# Patient Record
Sex: Female | Born: 1945 | Race: Black or African American | Hispanic: No | Smoking: Never smoker
Health system: Southern US, Community
[De-identification: ages and names within clinical notes are randomized; demographics above are authoritative.]

## PROBLEM LIST (undated history)

## (undated) DIAGNOSIS — F32A Depression, unspecified: Secondary | ICD-10-CM

## (undated) DIAGNOSIS — E119 Type 2 diabetes mellitus without complications: Secondary | ICD-10-CM

## (undated) DIAGNOSIS — F329 Major depressive disorder, single episode, unspecified: Secondary | ICD-10-CM

## (undated) DIAGNOSIS — Z973 Presence of spectacles and contact lenses: Secondary | ICD-10-CM

## (undated) DIAGNOSIS — R42 Dizziness and giddiness: Secondary | ICD-10-CM

## (undated) DIAGNOSIS — G4730 Sleep apnea, unspecified: Secondary | ICD-10-CM

## (undated) DIAGNOSIS — E785 Hyperlipidemia, unspecified: Secondary | ICD-10-CM

## (undated) DIAGNOSIS — K219 Gastro-esophageal reflux disease without esophagitis: Secondary | ICD-10-CM

## (undated) DIAGNOSIS — G629 Polyneuropathy, unspecified: Secondary | ICD-10-CM

## (undated) HISTORY — PX: APPENDECTOMY: SHX54

## (undated) HISTORY — PX: BREAST SURGERY: SHX581

## (undated) HISTORY — PX: ABDOMINAL HYSTERECTOMY: SHX81

## (undated) HISTORY — PX: DILATION AND CURETTAGE OF UTERUS: SHX78

---

## 1990-03-21 HISTORY — PX: COLECTOMY: SHX59

## 1993-03-21 HISTORY — PX: KNEE ARTHROSCOPY: SUR90

## 2007-03-14 ENCOUNTER — Emergency Department (HOSPITAL_COMMUNITY): Admission: EM | Admit: 2007-03-14 | Discharge: 2007-03-14 | Payer: Self-pay | Admitting: Emergency Medicine

## 2009-11-21 ENCOUNTER — Observation Stay (HOSPITAL_COMMUNITY): Admission: EM | Admit: 2009-11-21 | Discharge: 2009-11-22 | Payer: Self-pay | Admitting: Emergency Medicine

## 2010-06-03 LAB — BASIC METABOLIC PANEL
BUN: 12 mg/dL (ref 6–23)
CO2: 29 mEq/L (ref 19–32)
Calcium: 9 mg/dL (ref 8.4–10.5)
GFR calc Af Amer: >60 mL/min (ref >60)
Glucose, Bld: 120 mg/dL — ABNORMAL HIGH (ref 70–99)
Potassium: 3.6 mEq/L (ref 3.5–5.1)

## 2010-06-03 LAB — CBC
Hemoglobin: 11.3 g/dL — ABNORMAL LOW (ref 12.0–15.0)
Hemoglobin: 12.1 g/dL (ref 12.0–15.0)
MCV: 87.2 fL (ref 78.0–100.0)
Platelets: 268 10*3/uL (ref 150–400)

## 2010-06-03 LAB — DIFFERENTIAL
Basophils Absolute: 0 10*3/uL (ref 0.0–0.1)
Basophils Absolute: 0 10*3/uL (ref 0.0–0.1)
Basophils Relative: 1 % (ref 0–1)
Basophils Relative: 1 % (ref 0–1)
Eosinophils Absolute: 0.4 10*3/uL (ref 0.0–0.7)
Eosinophils Absolute: 0.5 10*3/uL (ref 0.0–0.7)
Eosinophils Relative: 6 % — ABNORMAL HIGH (ref 0–5)
Eosinophils Relative: 7 % — ABNORMAL HIGH (ref 0–5)
Lymphs Abs: 3.6 10*3/uL (ref 0.7–4.0)
Monocytes Absolute: 0.4 10*3/uL (ref 0.1–1.0)
Monocytes Relative: 6 % (ref 3–12)
Neutro Abs: 2.3 10*3/uL (ref 1.7–7.7)

## 2010-06-03 LAB — CARDIAC PANEL(CRET KIN+CKTOT+MB+TROPI)
CK, MB: 0.9 ng/mL (ref 0.3–4.0)
CK, MB: 1.4 ng/mL (ref 0.3–4.0)
Relative Index: 0.4 (ref 0.0–2.5)
Total CK: 209 U/L — ABNORMAL HIGH (ref 7–177)
Total CK: 215 U/L — ABNORMAL HIGH (ref 7–177)
Total CK: 231 U/L — ABNORMAL HIGH (ref 7–177)
Troponin I: 0.02 ng/mL (ref 0.00–0.06)
Troponin I: 0.02 ng/mL (ref 0.00–0.06)

## 2010-06-03 LAB — POCT CARDIAC MARKERS
CKMB, poc: <1 ng/mL — ABNORMAL LOW (ref 1.0–8.0)
Myoglobin, poc: 75.8 ng/mL (ref 12–200)
Troponin i, poc: <0.05 ng/mL (ref 0.00–0.09)

## 2010-06-03 LAB — CK TOTAL AND CKMB (NOT AT ARMC)
CK, MB: 1 ng/mL (ref 0.3–4.0)
Relative Index: 0.7 (ref 0.0–2.5)
Total CK: 152 U/L (ref 7–177)

## 2010-06-03 LAB — LIPASE, BLOOD: Lipase: 26 U/L (ref 11–59)

## 2010-06-03 LAB — TSH: TSH: 0.942 u[IU]/mL (ref 0.350–4.500)

## 2010-06-03 LAB — LIPID PANEL
Cholesterol: 183 mg/dL (ref 0–200)
Total CHOL/HDL Ratio: 4.9 RATIO
Triglycerides: 122 mg/dL (ref <150)
VLDL: 24 mg/dL (ref 0–40)

## 2010-06-03 LAB — COMPREHENSIVE METABOLIC PANEL
AST: 22 U/L (ref 0–37)
Albumin: 3.2 g/dL — ABNORMAL LOW (ref 3.5–5.2)
Chloride: 108 mEq/L (ref 96–112)

## 2010-06-03 LAB — MAGNESIUM: Magnesium: 1.9 mg/dL (ref 1.5–2.5)

## 2010-06-03 LAB — GLUCOSE, CAPILLARY
Glucose-Capillary: 117 mg/dL — ABNORMAL HIGH (ref 70–99)
Glucose-Capillary: 197 mg/dL — ABNORMAL HIGH (ref 70–99)

## 2010-06-03 LAB — HEMOGLOBIN A1C
Hgb A1c MFr Bld: 6.2 % — ABNORMAL HIGH (ref <5.7)
Mean Plasma Glucose: 131 mg/dL — ABNORMAL HIGH (ref <117)

## 2012-06-20 ENCOUNTER — Other Ambulatory Visit: Payer: Self-pay | Admitting: Orthopedic Surgery

## 2012-06-21 ENCOUNTER — Encounter (HOSPITAL_BASED_OUTPATIENT_CLINIC_OR_DEPARTMENT_OTHER): Payer: Self-pay | Admitting: *Deleted

## 2012-06-21 NOTE — Progress Notes (Signed)
To come in for [id number]  Bring all meds and overnight bag and cpap

## 2012-06-22 ENCOUNTER — Other Ambulatory Visit: Payer: Self-pay

## 2012-06-22 ENCOUNTER — Encounter (HOSPITAL_BASED_OUTPATIENT_CLINIC_OR_DEPARTMENT_OTHER)
Admission: RE | Admit: 2012-06-22 | Discharge: 2012-06-22 | Disposition: A | Discharge status: Home / Self Care | Payer: Medicare Other | Source: Ambulatory Visit | Attending: Orthopedic Surgery | Admitting: Orthopedic Surgery

## 2012-06-22 LAB — BASIC METABOLIC PANEL
BUN: 13 mg/dL (ref 6–23)
Calcium: 9.2 mg/dL (ref 8.4–10.5)
Creatinine, Ser: 0.59 mg/dL (ref 0.50–1.10)
GFR calc non Af Amer: >90 mL/min (ref >90)
Glucose, Bld: 96 mg/dL (ref 70–99)

## 2012-06-26 ENCOUNTER — Encounter (HOSPITAL_BASED_OUTPATIENT_CLINIC_OR_DEPARTMENT_OTHER): Payer: Self-pay | Admitting: *Deleted

## 2012-06-26 ENCOUNTER — Ambulatory Visit (HOSPITAL_BASED_OUTPATIENT_CLINIC_OR_DEPARTMENT_OTHER)
Admission: RE | Admit: 2012-06-26 | Discharge: 2012-06-26 | Disposition: A | Discharge status: Home / Self Care | Payer: Medicare Other | Source: Ambulatory Visit | Attending: Orthopedic Surgery | Admitting: Orthopedic Surgery

## 2012-06-26 ENCOUNTER — Encounter (HOSPITAL_BASED_OUTPATIENT_CLINIC_OR_DEPARTMENT_OTHER)
Admission: RE | Disposition: A | Discharge status: Home / Self Care | Payer: Self-pay | Source: Ambulatory Visit | Attending: Orthopedic Surgery

## 2012-06-26 ENCOUNTER — Encounter (HOSPITAL_BASED_OUTPATIENT_CLINIC_OR_DEPARTMENT_OTHER): Payer: Self-pay | Admitting: Anesthesiology

## 2012-06-26 ENCOUNTER — Ambulatory Visit (HOSPITAL_BASED_OUTPATIENT_CLINIC_OR_DEPARTMENT_OTHER): Payer: Medicare Other | Admitting: Anesthesiology

## 2012-06-26 DIAGNOSIS — Z7982 Long term (current) use of aspirin: Secondary | ICD-10-CM | POA: Insufficient documentation

## 2012-06-26 DIAGNOSIS — E785 Hyperlipidemia, unspecified: Secondary | ICD-10-CM | POA: Insufficient documentation

## 2012-06-26 DIAGNOSIS — Z91041 Radiographic dye allergy status: Secondary | ICD-10-CM | POA: Insufficient documentation

## 2012-06-26 DIAGNOSIS — W19XXXA Unspecified fall, initial encounter: Secondary | ICD-10-CM | POA: Insufficient documentation

## 2012-06-26 DIAGNOSIS — Z794 Long term (current) use of insulin: Secondary | ICD-10-CM | POA: Insufficient documentation

## 2012-06-26 DIAGNOSIS — E119 Type 2 diabetes mellitus without complications: Secondary | ICD-10-CM | POA: Insufficient documentation

## 2012-06-26 DIAGNOSIS — S52599A Other fractures of lower end of unspecified radius, initial encounter for closed fracture: Secondary | ICD-10-CM | POA: Insufficient documentation

## 2012-06-26 DIAGNOSIS — Z79899 Other long term (current) drug therapy: Secondary | ICD-10-CM | POA: Insufficient documentation

## 2012-06-26 DIAGNOSIS — Y92410 Unspecified street and highway as the place of occurrence of the external cause: Secondary | ICD-10-CM | POA: Insufficient documentation

## 2012-06-26 DIAGNOSIS — F3289 Other specified depressive episodes: Secondary | ICD-10-CM | POA: Insufficient documentation

## 2012-06-26 DIAGNOSIS — F329 Major depressive disorder, single episode, unspecified: Secondary | ICD-10-CM | POA: Insufficient documentation

## 2012-06-26 DIAGNOSIS — G4730 Sleep apnea, unspecified: Secondary | ICD-10-CM | POA: Insufficient documentation

## 2012-06-26 DIAGNOSIS — K219 Gastro-esophageal reflux disease without esophagitis: Secondary | ICD-10-CM | POA: Insufficient documentation

## 2012-06-26 DIAGNOSIS — G609 Hereditary and idiopathic neuropathy, unspecified: Secondary | ICD-10-CM | POA: Insufficient documentation

## 2012-06-26 HISTORY — DX: Dizziness and giddiness: R42

## 2012-06-26 HISTORY — DX: Presence of spectacles and contact lenses: Z97.3

## 2012-06-26 HISTORY — DX: Polyneuropathy, unspecified: G62.9

## 2012-06-26 HISTORY — DX: Gastro-esophageal reflux disease without esophagitis: K21.9

## 2012-06-26 HISTORY — PX: OPEN REDUCTION INTERNAL FIXATION (ORIF) DISTAL RADIAL FRACTURE: SHX5989

## 2012-06-26 HISTORY — DX: Hyperlipidemia, unspecified: E78.5

## 2012-06-26 HISTORY — DX: Depression, unspecified: F32.A

## 2012-06-26 HISTORY — DX: Sleep apnea, unspecified: G47.30

## 2012-06-26 HISTORY — DX: Type 2 diabetes mellitus without complications: E11.9

## 2012-06-26 HISTORY — DX: Major depressive disorder, single episode, unspecified: F32.9

## 2012-06-26 LAB — GLUCOSE, CAPILLARY
Glucose-Capillary: 112 mg/dL — ABNORMAL HIGH (ref 70–99)
Glucose-Capillary: 92 mg/dL (ref 70–99)

## 2012-06-26 SURGERY — OPEN REDUCTION INTERNAL FIXATION (ORIF) DISTAL RADIUS FRACTURE
Anesthesia: General | Laterality: Left | Wound class: Clean

## 2012-06-26 MED ORDER — CEFAZOLIN SODIUM-DEXTROSE 2-3 GM-% IV SOLR
2.0000 g | INTRAVENOUS | Status: AC
  Administered 2012-06-26: 2 g via INTRAVENOUS

## 2012-06-26 MED ORDER — LIDOCAINE HCL 1 % IJ SOLN
INTRAMUSCULAR | Status: DC | PRN
  Administered 2012-06-26: 2 mL via INTRADERMAL

## 2012-06-26 MED ORDER — CHLORHEXIDINE GLUCONATE 4 % EX LIQD: 60.0000 mL | Freq: Once | CUTANEOUS | Status: DC

## 2012-06-26 MED ORDER — OXYCODONE-ACETAMINOPHEN 5-325 MG PO TABS: ORAL_TABLET | ORAL | Status: AC

## 2012-06-26 MED ORDER — METOCLOPRAMIDE HCL 5 MG/ML IJ SOLN
INTRAMUSCULAR | Status: DC | PRN
  Administered 2012-06-26: 10 mg via INTRAVENOUS

## 2012-06-26 MED ORDER — METOCLOPRAMIDE HCL 5 MG/ML IJ SOLN: 10.0000 mg | Freq: Once | INTRAMUSCULAR | Status: DC | PRN

## 2012-06-26 MED ORDER — OXYCODONE HCL 5 MG/5ML PO SOLN
5.0000 mg | Freq: Once | ORAL | Status: DC | PRN
Start: 2012-06-26 — End: 2012-06-26

## 2012-06-26 MED ORDER — FENTANYL CITRATE 0.05 MG/ML IJ SOLN: 25.0000 ug | INTRAMUSCULAR | Status: DC | PRN

## 2012-06-26 MED ORDER — MIDAZOLAM HCL 2 MG/2ML IJ SOLN
1.0000 mg | INTRAMUSCULAR | Status: DC | PRN
  Administered 2012-06-26: 2 mg via INTRAVENOUS

## 2012-06-26 MED ORDER — FENTANYL CITRATE 0.05 MG/ML IJ SOLN
50.0000 ug | INTRAMUSCULAR | Status: DC | PRN
  Administered 2012-06-26: 100 ug via INTRAVENOUS

## 2012-06-26 MED ORDER — ROPIVACAINE HCL 5 MG/ML IJ SOLN
INTRAMUSCULAR | Status: DC | PRN
  Administered 2012-06-26: 30 mL

## 2012-06-26 MED ORDER — EPHEDRINE SULFATE 50 MG/ML IJ SOLN
INTRAMUSCULAR | Status: DC | PRN
  Administered 2012-06-26: 10 mg via INTRAVENOUS

## 2012-06-26 MED ORDER — ONDANSETRON HCL 4 MG/2ML IJ SOLN
INTRAMUSCULAR | Status: DC | PRN
  Administered 2012-06-26: 4 mg via INTRAVENOUS

## 2012-06-26 MED ORDER — OXYCODONE HCL 5 MG PO TABS: 5.0000 mg | ORAL_TABLET | Freq: Once | ORAL | Status: DC | PRN

## 2012-06-26 MED ORDER — LIDOCAINE HCL (CARDIAC) 20 MG/ML IV SOLN
INTRAVENOUS | Status: DC | PRN
  Administered 2012-06-26: 60 mg via INTRAVENOUS

## 2012-06-26 MED ORDER — PROPOFOL 10 MG/ML IV BOLUS
INTRAVENOUS | Status: DC | PRN
  Administered 2012-06-26: 200 mg via INTRAVENOUS
  Administered 2012-06-26: 30 mg via INTRAVENOUS

## 2012-06-26 MED ORDER — LACTATED RINGERS IV SOLN
INTRAVENOUS | Status: DC
  Administered 2012-06-26: 11:00:00 via INTRAVENOUS

## 2012-06-26 SURGICAL SUPPLY — 72 items
BANDAGE ELASTIC 3 VELCRO ST LF (GAUZE/BANDAGES/DRESSINGS) ×2 IMPLANT
BANDAGE GAUZE ELAST BULKY 4 IN (GAUZE/BANDAGES/DRESSINGS) ×2 IMPLANT
BIT DRILL 2.0 LNG QUCK RELEASE (BIT) ×1 IMPLANT
BIT DRILL 2.8X5 QR DISP (BIT) ×2 IMPLANT
BLADE MINI RND TIP GREEN BEAV (BLADE) IMPLANT
BLADE SURG 15 STRL LF DISP TIS (BLADE) ×2 IMPLANT
BLADE SURG 15 STRL SS (BLADE) ×2
BNDG ESMARK 4X9 LF (GAUZE/BANDAGES/DRESSINGS) ×2 IMPLANT
BONE CHIP PRESERV 5CC PCAN5 (Bone Implant) ×2 IMPLANT
CHLORAPREP W/TINT 26ML (MISCELLANEOUS) ×2 IMPLANT
CLOTH BEACON ORANGE TIMEOUT ST (SAFETY) ×2 IMPLANT
CORDS BIPOLAR (ELECTRODE) ×2 IMPLANT
COVER MAYO STAND STRL (DRAPES) ×2 IMPLANT
COVER TABLE BACK 60X90 (DRAPES) ×2 IMPLANT
DRAPE EXTREMITY T 121X128X90 (DRAPE) ×2 IMPLANT
DRAPE OEC MINIVIEW 54X84 (DRAPES) ×2 IMPLANT
DRAPE SURG 17X23 STRL (DRAPES) ×2 IMPLANT
DRILL 2.0 LNG QUICK RELEASE (BIT) ×2
GAUZE XEROFORM 1X8 LF (GAUZE/BANDAGES/DRESSINGS) ×2 IMPLANT
GLOVE BIO SURGEON STRL SZ 6.5 (GLOVE) ×2 IMPLANT
GLOVE BIO SURGEON STRL SZ7.5 (GLOVE) ×2 IMPLANT
GLOVE BIOGEL PI IND STRL 8 (GLOVE) ×1 IMPLANT
GLOVE BIOGEL PI IND STRL 8.5 (GLOVE) ×1 IMPLANT
GLOVE BIOGEL PI INDICATOR 8 (GLOVE) ×1
GLOVE BIOGEL PI INDICATOR 8.5 (GLOVE) ×1
GLOVE ECLIPSE 6.5 STRL STRAW (GLOVE) ×2 IMPLANT
GLOVE INDICATOR 6.5 STRL GRN (GLOVE) ×2 IMPLANT
GLOVE INDICATOR 7.0 STRL GRN (GLOVE) ×2 IMPLANT
GLOVE SKINSENSE NS SZ6.5 (GLOVE) ×1
GLOVE SKINSENSE STRL SZ6.5 (GLOVE) ×1 IMPLANT
GLOVE SURG ORTHO 8.0 STRL STRW (GLOVE) ×2 IMPLANT
GOWN BRE IMP PREV XXLGXLNG (GOWN DISPOSABLE) ×4 IMPLANT
GOWN PREVENTION PLUS XLARGE (GOWN DISPOSABLE) ×4 IMPLANT
GUIDEWIRE ORTHO 0.054X6 (WIRE) ×6 IMPLANT
NEEDLE HYPO 25X1 1.5 SAFETY (NEEDLE) ×2 IMPLANT
NS IRRIG 1000ML POUR BTL (IV SOLUTION) ×2 IMPLANT
PACK BASIN DAY SURGERY FS (CUSTOM PROCEDURE TRAY) ×2 IMPLANT
PAD CAST 3X4 CTTN HI CHSV (CAST SUPPLIES) ×1 IMPLANT
PADDING CAST ABS 4INX4YD NS (CAST SUPPLIES) ×1
PADDING CAST ABS COTTON 4X4 ST (CAST SUPPLIES) ×1 IMPLANT
PADDING CAST COTTON 3X4 STRL (CAST SUPPLIES) ×1
PLATE LEFT DIST RADIUS NARROW (Plate) ×2 IMPLANT
SCREW CORT FT 20X2.3XLCK HEX (Screw) ×1 IMPLANT
SCREW CORT FT 22X2.3XLCK HEX (Screw) ×1 IMPLANT
SCREW CORTICAL LOCKING 2.3X18M (Screw) ×2 IMPLANT
SCREW CORTICAL LOCKING 2.3X20M (Screw) ×2 IMPLANT
SCREW CORTICAL LOCKING 2.3X22M (Screw) ×2 IMPLANT
SCREW FX18X2.3XSMTH LCK NS CRT (Screw) ×2 IMPLANT
SCREW FX20X2.3XSMTH LCK NS CRT (Screw) ×1 IMPLANT
SCREW FX22X2.3XLCK SMTH NS CRT (Screw) ×1 IMPLANT
SCREW HEXALOBE NON-LOCK 3.5X14 (Screw) ×2 IMPLANT
SCREW NLCKG 13 3.5X13 HEXA (Screw) ×1 IMPLANT
SCREW NON TOGG 2.3X20MM (Screw) ×2 IMPLANT
SCREW NON TOGG 2.3X22MM (Screw) ×2 IMPLANT
SCREW NON-LOCK 3.5X13 (Screw) ×2 IMPLANT
SCREW NONLOCK HEX 3.5X12 (Screw) ×4 IMPLANT
SLEEVE SCD COMPRESS KNEE MED (MISCELLANEOUS) ×2 IMPLANT
SPLINT PLASTER CAST XFAST 4X15 (CAST SUPPLIES) IMPLANT
SPLINT PLASTER XTRA FAST SET 4 (CAST SUPPLIES)
SPONGE GAUZE 4X4 12PLY (GAUZE/BANDAGES/DRESSINGS) ×2 IMPLANT
STOCKINETTE 4X48 STRL (DRAPES) ×2 IMPLANT
SUCTION FRAZIER TIP 10 FR DISP (SUCTIONS) IMPLANT
SUT ETHILON 3 0 PS 1 (SUTURE) IMPLANT
SUT ETHILON 4 0 PS 2 18 (SUTURE) ×4 IMPLANT
SUT VIC AB 3-0 PS1 18 (SUTURE)
SUT VIC AB 3-0 PS1 18XBRD (SUTURE) IMPLANT
SUT VICRYL 4-0 PS2 18IN ABS (SUTURE) ×2 IMPLANT
SYR BULB 3OZ (MISCELLANEOUS) ×2 IMPLANT
SYR CONTROL 10ML LL (SYRINGE) ×2 IMPLANT
TOWEL OR 17X24 6PK STRL BLUE (TOWEL DISPOSABLE) ×2 IMPLANT
TUBE CONNECTING 20X1/4 (TUBING) IMPLANT
UNDERPAD 30X30 INCONTINENT (UNDERPADS AND DIAPERS) ×2 IMPLANT

## 2012-06-26 NOTE — H&P (Signed)
Adriana Wiley is an 67 y.o. female.   Chief Complaint: left distal radius fracture HPI: 67 yo rhd female states Adriana Wiley fell on sidewalk 06/11/12 injuring left wrist.  Seen at [facility] where XR and CT revealed comminuted intraarticular distal radius fracture.  Adriana Wiley was referred for further care.  Reports no previous injury to wrist.  Past Medical History  Diagnosis Date  . Hyperlipemia   . Sleep apnea     uses a cpap  . Diabetes mellitus without complication   . GERD (gastroesophageal reflux disease)   . Depression   . Neuropathy, peripheral   . Vertigo     chronic  . Wears glasses     Past Surgical History  Procedure Laterality Date  . Colectomy  1992    cancer  . Abdominal hysterectomy    . Appendectomy    . Dilation and curettage of uterus    . Breast surgery      cyst lt br-neg  . Knee arthroscopy  1995    left    History reviewed. No pertinent family history. Social History:  reports that Adriana Wiley has never smoked. Adriana Wiley does not have any smokeless tobacco history on file. Adriana Wiley reports that Adriana Wiley does not drink alcohol or use illicit drugs.  Allergies:  Allergies  Allergen Reactions  . Contrast Media (Iodinated Diagnostic Agents) Hives    Medications Prior to Admission  Medication Sig Dispense Refill  . acetaminophen (TYLENOL) 500 MG tablet Take 500 mg by mouth every 6 (six) hours as needed for pain.      Marland Kitchen aspirin 81 MG tablet Take 81 mg by mouth daily.      . fluticasone (FLONASE) 50 MCG/ACT nasal spray Place 2 sprays into the nose daily.      . fluticasone (FLOVENT HFA) 220 MCG/ACT inhaler Inhale 1 puff into the lungs 2 (two) times daily.      Marland Kitchen gabapentin (NEURONTIN) 100 MG capsule Take 100 mg by mouth 3 (three) times daily.      . insulin aspart (NOVOLOG) 100 UNIT/ML injection Inject 10 Units into the skin 2 (two) times daily between meals.      . insulin glargine (LANTUS) 100 UNIT/ML injection Inject 50 Units into the skin daily.      . meclizine (ANTIVERT) 25  MG tablet Take 25 mg by mouth 4 (four) times daily as needed.      . metFORMIN (GLUCOPHAGE) 500 MG tablet Take 500 mg by mouth daily with breakfast.      . omeprazole (PRILOSEC) 20 MG capsule Take 20 mg by mouth daily.      . simvastatin (ZOCOR) 20 MG tablet Take 20 mg by mouth at bedtime.        Results for orders placed during the hospital encounter of 06/26/12 (from the past 48 hour(s))  GLUCOSE, CAPILLARY     Status: Abnormal   Collection Time    06/26/12 10:28 AM      Result Value Range   Glucose-Capillary 112 (*) 70 - 99 mg/dL  POCT HEMOGLOBIN-HEMACUE     Status: None   Collection Time    06/26/12 10:29 AM      Result Value Range   Hemoglobin 12.7  12.0 - 15.0 g/dL    No results found.   A comprehensive review of systems was negative except for: Constitutional: positive for fevers Eyes: positive for contacts/glasses Ears, nose, mouth, throat, and face: positive for hearing loss and tinnitus Respiratory: positive for cough Hematologic/lymphatic: positive for  easy bruising Neurological: positive for gait problems and headaches Behavioral/Psych: positive for anxiety, depression and sleep disturbance  Blood pressure 147/82, pulse 75, temperature 98 F (36.7 C), temperature source Oral, resp. rate 18, height 5\' 2"  (1.575 m), weight 92.987 kg (205 lb), SpO2 96.00%.  General appearance: alert, cooperative and appears stated age Head: Normocephalic, without obvious abnormality, atraumatic Neck: supple, symmetrical, trachea midline Resp: clear to auscultation bilaterally Cardio: regular rate and rhythm GI: non tender Extremities: intact sensation and capillary refill all digits.  +epl/fpl/io.  skin intact. Pulses: 2+ and symmetric Skin: Skin color, texture, turgor normal. No rashes or lesions Neurologic: Grossly normal Incision/Wound: na  Assessment/Plan Left comminuted intraarticular distal radius fracture.  Non operative and operative treatment options were discussed  with the patient and patient wishes to proceed with operative treatment. Risks, benefits, and alternatives of surgery were discussed and the patient agrees with the plan of care.   [name] 06/26/2012, 10:44 AM

## 2012-06-26 NOTE — Anesthesia Procedure Notes (Addendum)
Procedure Name: LMA Insertion Date/Time: 06/26/2012 11:48 AM Performed by: Meyer Russel Pre-anesthesia Checklist: Patient identified, Emergency Drugs available, Suction available and Patient being monitored Patient Re-evaluated:Patient Re-evaluated prior to inductionOxygen Delivery Method: Circle System Utilized Preoxygenation: Pre-oxygenation with 100% oxygen Intubation Type: IV induction Ventilation: Mask ventilation without difficulty LMA: LMA with gastric port inserted LMA Size: 4.0 Number of attempts: 1 Airway Equipment and Method: bite block Placement Confirmation: positive ETCO2 and breath sounds checked- equal and bilateral Tube secured with: Tape Dental Injury: Teeth and Oropharynx as per pre-operative assessment        Narrative:    Anesthesia Regional Block:  Supraclavicular block  Pre-Anesthetic Checklist: ,, timeout performed, Correct Patient, Correct Site, Correct Laterality, Correct Procedure, Correct Position, site marked, Risks and benefits discussed,  Surgical consent,  Pre-op evaluation,  At surgeon's request and post-op pain management  Laterality: Left  Prep: chloraprep       Needles:   Needle Type: Other     Needle Length: 9cm  Needle Gauge: 21    Additional Needles:  Procedures: ultrasound guided (picture in chart) Supraclavicular block Narrative:  Start time: 06/26/2012 11:00 AM End time: 06/26/2012 11:08 AM Injection made incrementally with aspirations every 5 mL.  Performed by: Personally  Anesthesiologist: C Corbyn Steedman  Additional Notes: Ultrasound guidance used to: id relevant anatomy, confirm needle position, local anesthetic spread, avoidance of vascular puncture. Picture saved. No complications. Block performed personally by Janetta Hora. Gelene Mink, MD    Supraclavicular block

## 2012-06-26 NOTE — Op Note (Signed)
[id number] 

## 2012-06-26 NOTE — Brief Op Note (Signed)
06/26/2012  1:06 PM  PATIENT:  Beatrix Shipper  67 y.o. female  PRE-OPERATIVE DIAGNOSIS:  Left Distal Radius Fracture  POST-OPERATIVE DIAGNOSIS:  Left Distal Radius Fracture  PROCEDURE:  Procedure(s): OPEN REDUCTION INTERNAL FIXATION (ORIF) LEFT DISTAL RADIAL FRACTURE (Left)  SURGEON:  Surgeon(s) and Role:    * Tami Ribas, MD - Primary    * Nicki Reaper, MD - Assisting  PHYSICIAN ASSISTANT:   ASSISTANTS: Cindee Salt, MD   ANESTHESIA:   regional and general  EBL:  Total I/O In: 1000 [I.V.:1000] Out: -   BLOOD ADMINISTERED:none  DRAINS: none   LOCAL MEDICATIONS USED:  NONE  SPECIMEN:  No Specimen  DISPOSITION OF SPECIMEN:  N/A  COUNTS:  YES  TOURNIQUET:  * Missing tourniquet times found for documented tourniquets in log:  [id number] *  DICTATION: .Other Dictation: Dictation Number [id number]  PLAN OF CARE: Discharge to home after PACU  PATIENT DISPOSITION:  PACU - hemodynamically stable.

## 2012-06-26 NOTE — Progress Notes (Signed)
Assisted Dr. Frederick with left, ultrasound guided, supraclavicular block. Side rails up, monitors on throughout procedure. See vital signs in flow sheet. Tolerated Procedure well. 

## 2012-06-26 NOTE — Anesthesia Preprocedure Evaluation (Signed)
Anesthesia Evaluation  Patient identified by MRN, date of birth, ID band Patient awake    Reviewed: Allergy & Precautions, H&P , NPO status , Patient's Chart, lab work & pertinent test results, reviewed documented beta blocker date and time   Airway Mallampati: II TM Distance: >3 FB Neck ROM: full    Dental   Pulmonary sleep apnea ,  breath sounds clear to auscultation        Cardiovascular negative cardio ROS  Rhythm:regular     Neuro/Psych PSYCHIATRIC DISORDERS  Neuromuscular disease    GI/Hepatic Neg liver ROS, GERD-  Medicated and Controlled,  Endo/Other  diabetes, Insulin Dependent and Oral Hypoglycemic AgentsMorbid obesity  Renal/GU negative Renal ROS  negative genitourinary   Musculoskeletal   Abdominal   Peds  Hematology negative hematology ROS (+)   Anesthesia Other Findings See surgeon's H&P   Reproductive/Obstetrics negative OB ROS                           Anesthesia Physical Anesthesia Plan  ASA: III  Anesthesia Plan: General   Post-op Pain Management:    Induction: Intravenous  Airway Management Planned: LMA  Additional Equipment:   Intra-op Plan:   Post-operative Plan: Extubation in OR  Informed Consent: I have reviewed the patients History and Physical, chart, labs and discussed the procedure including the risks, benefits and alternatives for the proposed anesthesia with the patient or authorized representative who has indicated his/her understanding and acceptance.   Dental Advisory Given  Plan Discussed with: CRNA and Surgeon  Anesthesia Plan Comments:         Anesthesia Quick Evaluation

## 2012-06-26 NOTE — Anesthesia Postprocedure Evaluation (Signed)
Anesthesia Post Note  Patient: Adriana Wiley  Procedure(s) Performed: Procedure(s) (LRB): OPEN REDUCTION INTERNAL FIXATION (ORIF) LEFT DISTAL RADIAL FRACTURE (Left)  Anesthesia type: General  Patient location: PACU  Post pain: Pain level controlled  Post assessment: Patient's Cardiovascular Status Stable  Last Vitals:  Filed Vitals:   06/26/12 1345  BP: 125/63  Pulse: 77  Temp:   Resp: 13    Post vital signs: Reviewed and stable  Level of consciousness: alert  Complications: No apparent anesthesia complications

## 2012-06-26 NOTE — Transfer of Care (Signed)
Immediate Anesthesia Transfer of Care Note  Patient: Adriana Wiley  Procedure(s) Performed: Procedure(s): OPEN REDUCTION INTERNAL FIXATION (ORIF) LEFT DISTAL RADIAL FRACTURE (Left)  Patient Location: PACU  Anesthesia Type:General and Regional  Level of Consciousness: awake and alert   Airway & Oxygen Therapy: Patient Spontanous Breathing and Patient connected to face mask oxygen  Post-op Assessment: Report given to PACU RN and Post -op Vital signs reviewed and stable  Post vital signs: Reviewed and stable  Complications: No apparent anesthesia complications

## 2012-06-27 ENCOUNTER — Encounter (HOSPITAL_BASED_OUTPATIENT_CLINIC_OR_DEPARTMENT_OTHER): Payer: Self-pay | Admitting: Orthopedic Surgery

## 2012-06-27 NOTE — Op Note (Deleted)
NAMEALLE, DIFABIO           ACCOUNT NO.:  [id number]  MEDICAL RECORD NO.:  [id number]  LOCATION:  3A                           FACILITY:  [facility]  PHYSICIAN:  Betha Loa, MD        DATE OF BIRTH:  1945/05/22  DATE OF PROCEDURE:  06/26/2012 DATE OF DISCHARGE:  06/26/2012                              OPERATIVE REPORT   PREOPERATIVE DIAGNOSIS:  Left comminuted intra-articular distal radius fracture.  POSTOPERATIVE DIAGNOSIS:  Left comminuted intra-articular distal radius fracture.  PROCEDURE:  Open reduction and internal fixation of left comminuted intra-articular distal radius fracture.  SURGEON:  Betha Loa, MD  ASSISTANT:  Cindee Salt, M.D.  ANESTHESIA:  General with regional.  IV FLUIDS:  Per anesthesia flow sheet.  ESTIMATED BLOOD LOSS:  Minimal.  COMPLICATIONS:  None.  SPECIMENS:  None.  TOURNIQUET TIME:  Approximately 1 hour.  DISPOSITION:  Stable to PACU.  INDICATIONS:  Adriana Wiley is a 67 year old female who fell approximately 2 weeks ago injuring her left wrist.  She was seen at [facility], where radiographs and a CT revealed a comminuted intra- articular distal radius fracture.  She was referred to me for care.  On evaluation, we discussed the nature of the injury.  We discussed nonoperative and operative treatment options including open reduction and internal fixation.  She elected to proceed with operative treatment. Risks, benefits, and alternatives of surgery were discussed including the risk of blood loss, infection, damage to nerves, vessels, tendons, ligaments, bone; failure of surgery; need for additional surgery, complications with wound healing, continued pain, nonunion, malunion, stiffness.  She voiced understanding and wished and elected to proceed.  OPERATIVE COURSE:  After being identified preoperatively by myself, the patient and I agreed upon procedure and site procedure.  Surgical site was marked.  The risks, benefits, and  alternatives of surgery were reviewed and she wished to proceed.  Surgical consent had been signed. She was given 2 g of IV Ancef as preoperative antibiotic prophylaxis.  A regional block was performed by anesthesia in preoperative holding.  She was transported to the operating room and placed on the operating table in supine position with the left upper extremity on arm board.  General anesthesia was induced by anesthesiologist.  Left upper extremity was prepped and draped in normal sterile orthopedic fashion.  A surgical pause was performed between surgeons, anesthesia, and operating staff, and all were in agreement as to the patient, procedure, and the site of the procedure.  Tourniquet at the proximal aspect of the extremity was inflated to 250 mmHg after exsanguination of the limb with Esmarch bandage.  A standard volar Sherilyn Cooter approach was used.  The superficial and deep portions of the FCR tendon sheath were incised and the FCR and FPL swept ulnarly to protect the palmar cutaneous branch of the median nerve.  Bipolar electrocautery was used to obtain hemostasis.  The brachioradialis was released.  The pronator was released from the radial side of the radius and elevated with a periosteal elevator.  The fracture site was identified.  It was cleared of soft tissue interposition.  C-arm was used to aid in reduction.  Once reduction was obtained, it was felt that bone  graft would be appropriate.  Cancellous bone chips were then packed into place in the distal radius.  The C-arm was again used in AP and lateral projections to ensure appropriate reduction and position which was the case.  An AccuMed volar distal radial locking plate was selected and ER plate was used.  It was secured to the radius using the guide pins.  It was adjusted until fit was appropriate.  This was tracked using the C-arm.  A single screw was placed in a slotted hole in the shaft of the plate using standard  AO drilling measuring technique.  The distal screw holes were then filled. A provisional nonlocking screw was placed in the ulnar side to bring the bone up to the plate.  Good purchase was obtained.  The remaining holes were filled with locking pegs with the exception of the radial styloid holes which were filled with locking screws.  The nonlocking screw was then exchanged for a locking pegs.  The remaining 2 holes in the shaft of the plate were filled with standard AO drilling measuring technique and nonlocking screws.  The C-arm was used in AP and lateral projections to ensure appropriate reduction and position of hardware which was the case.  There was no intra-articular penetration.  The wound was copiously irrigated with sterile saline.  The pronator quadratus was repaired back over top of the plate as best possible using 4-0 Vicryl suture.  Two inverted and interrupted Vicryl sutures were placed in the subcutaneous tissues and the skin was closed with 4-0 nylon in a horizontal mattress fashion.  The wound was dressed with sterile Xeroform, 4x4s, and wrapped with a Kerlix bandage.  The forearm had full pronation and supination and the distal radioulnar joint felt stable throughout.  A volar splint was placed and wrapped with Kerlix and Ace bandage.  Tourniquet was deflated at approximately 1 hour.  Operative drapes were broken down.  The patient was awoken from anesthesia safely. She was transferred back to the stretcher and taken to PACU in stable condition.  I will see her back in the office in 1 week for postoperative followup.  I will give her Percocet 5/325, 1-2 p.o. q.6 hours p.r.n. pain, dispensed #50.     Betha Loa, MD     KK/MEDQ  D:  06/26/2012  T:  06/27/2012  Job:  [id number]

## 2012-06-27 NOTE — Op Note (Signed)
NAME:  Adriana Wiley, Adriana Wiley           ACCOUNT NO.:  [id number]  MEDICAL RECORD NO.:  [id number]  LOCATION:  3A                           FACILITY:  [facility]  PHYSICIAN:  Jaquis Picklesimer, MD        DATE OF BIRTH:  02/26/1946  DATE OF PROCEDURE:  06/26/2012 DATE OF DISCHARGE:  06/26/2012                              OPERATIVE REPORT   PREOPERATIVE DIAGNOSIS:  Left comminuted intra-articular distal radius fracture.  POSTOPERATIVE DIAGNOSIS:  Left comminuted intra-articular distal radius fracture.  PROCEDURE:  Open reduction and internal fixation of left comminuted intra-articular distal radius fracture.  SURGEON:  Shauni Henner, MD  ASSISTANT:  Gary Tacie Mccuistion, M.D.  ANESTHESIA:  General with regional.  IV FLUIDS:  Per anesthesia flow sheet.  ESTIMATED BLOOD LOSS:  Minimal.  COMPLICATIONS:  None.  SPECIMENS:  None.  TOURNIQUET TIME:  Approximately 1 hour.  DISPOSITION:  Stable to PACU.  INDICATIONS:  Ms. Reber is a 67-year-old female who fell approximately 2 weeks ago injuring her left wrist.  She was seen at [facility], where radiographs and a CT revealed a comminuted intra- articular distal radius fracture.  She was referred to me for care.  On evaluation, we discussed the nature of the injury.  We discussed nonoperative and operative treatment options including open reduction and internal fixation.  She elected to proceed with operative treatment. Risks, benefits, and alternatives of surgery were discussed including the risk of blood loss, infection, damage to nerves, vessels, tendons, ligaments, bone; failure of surgery; need for additional surgery, complications with wound healing, continued pain, nonunion, malunion, stiffness.  She voiced understanding and wished and elected to proceed.  OPERATIVE COURSE:  After being identified preoperatively by myself, the patient and I agreed upon procedure and site procedure.  Surgical site was marked.  The risks, benefits, and  alternatives of surgery were reviewed and she wished to proceed.  Surgical consent had been signed. She was given 2 g of IV Ancef as preoperative antibiotic prophylaxis.  A regional block was performed by anesthesia in preoperative holding.  She was transported to the operating room and placed on the operating table in supine position with the left upper extremity on arm board.  General anesthesia was induced by anesthesiologist.  Left upper extremity was prepped and draped in normal sterile orthopedic fashion.  A surgical pause was performed between surgeons, anesthesia, and operating staff, and all were in agreement as to the patient, procedure, and the site of the procedure.  Tourniquet at the proximal aspect of the extremity was inflated to 250 mmHg after exsanguination of the limb with Esmarch bandage.  A standard volar Henry approach was used.  The superficial and deep portions of the FCR tendon sheath were incised and the FCR and FPL swept ulnarly to protect the palmar cutaneous branch of the median nerve.  Bipolar electrocautery was used to obtain hemostasis.  The brachioradialis was released.  The pronator was released from the radial side of the radius and elevated with a periosteal elevator.  The fracture site was identified.  It was cleared of soft tissue interposition.  C-arm was used to aid in reduction.  Once reduction was obtained, it was felt that bone   graft would be appropriate.  Cancellous bone chips were then packed into place in the distal radius.  The C-arm was again used in AP and lateral projections to ensure appropriate reduction and position which was the case.  An AccuMed volar distal radial locking plate was selected and ER plate was used.  It was secured to the radius using the guide pins.  It was adjusted until fit was appropriate.  This was tracked using the C-arm.  A single screw was placed in a slotted hole in the shaft of the plate using standard  AO drilling measuring technique.  The distal screw holes were then filled. A provisional nonlocking screw was placed in the ulnar side to bring the bone up to the plate.  Good purchase was obtained.  The remaining holes were filled with locking pegs with the exception of the radial styloid holes which were filled with locking screws.  The nonlocking screw was then exchanged for a locking pegs.  The remaining 2 holes in the shaft of the plate were filled with standard AO drilling measuring technique and nonlocking screws.  The C-arm was used in AP and lateral projections to ensure appropriate reduction and position of hardware which was the case.  There was no intra-articular penetration.  The wound was copiously irrigated with sterile saline.  The pronator quadratus was repaired back over top of the plate as best possible using 4-0 Vicryl suture.  Two inverted and interrupted Vicryl sutures were placed in the subcutaneous tissues and the skin was closed with 4-0 nylon in a horizontal mattress fashion.  The wound was dressed with sterile Xeroform, 4x4s, and wrapped with a Kerlix bandage.  The forearm had full pronation and supination and the distal radioulnar joint felt stable throughout.  A volar splint was placed and wrapped with Kerlix and Ace bandage.  Tourniquet was deflated at approximately 1 hour.  Operative drapes were broken down.  The patient was awoken from anesthesia safely. She was transferred back to the stretcher and taken to PACU in stable condition.  I will see her back in the office in 1 week for postoperative followup.  I will give her Percocet 5/325, 1-2 Wiley.o. q.6 hours Wiley.r.n. pain, dispensed #50.     Lonzy Mato, MD     KK/MEDQ  D:  06/26/2012  T:  06/27/2012  Job:  [id number] 

## 2012-08-06 DIAGNOSIS — E1140 Type 2 diabetes mellitus with diabetic neuropathy, unspecified: Secondary | ICD-10-CM | POA: Insufficient documentation

## 2012-08-06 DIAGNOSIS — J45909 Unspecified asthma, uncomplicated: Secondary | ICD-10-CM | POA: Insufficient documentation

## 2013-08-29 ENCOUNTER — Ambulatory Visit (INDEPENDENT_AMBULATORY_CARE_PROVIDER_SITE_OTHER): Payer: Medicare Other | Admitting: Otolaryngology

## 2013-08-29 DIAGNOSIS — H9319 Tinnitus, unspecified ear: Secondary | ICD-10-CM

## 2013-08-29 DIAGNOSIS — H903 Sensorineural hearing loss, bilateral: Secondary | ICD-10-CM

## 2013-08-29 DIAGNOSIS — R42 Dizziness and giddiness: Secondary | ICD-10-CM

## 2013-09-25 ENCOUNTER — Encounter (HOSPITAL_COMMUNITY): Payer: Self-pay | Admitting: Emergency Medicine

## 2013-09-25 ENCOUNTER — Emergency Department (HOSPITAL_COMMUNITY)
Admission: EM | Admit: 2013-09-25 | Discharge: 2013-09-25 | Disposition: A | Discharge status: Home / Self Care | Payer: Medicare Other | Attending: Emergency Medicine | Admitting: Emergency Medicine

## 2013-09-25 DIAGNOSIS — T7840XA Allergy, unspecified, initial encounter: Secondary | ICD-10-CM

## 2013-09-25 DIAGNOSIS — Z794 Long term (current) use of insulin: Secondary | ICD-10-CM | POA: Insufficient documentation

## 2013-09-25 DIAGNOSIS — G609 Hereditary and idiopathic neuropathy, unspecified: Secondary | ICD-10-CM | POA: Insufficient documentation

## 2013-09-25 DIAGNOSIS — E119 Type 2 diabetes mellitus without complications: Secondary | ICD-10-CM | POA: Insufficient documentation

## 2013-09-25 DIAGNOSIS — M79609 Pain in unspecified limb: Secondary | ICD-10-CM | POA: Insufficient documentation

## 2013-09-25 DIAGNOSIS — F329 Major depressive disorder, single episode, unspecified: Secondary | ICD-10-CM | POA: Insufficient documentation

## 2013-09-25 DIAGNOSIS — G4730 Sleep apnea, unspecified: Secondary | ICD-10-CM | POA: Insufficient documentation

## 2013-09-25 DIAGNOSIS — I10 Essential (primary) hypertension: Secondary | ICD-10-CM | POA: Insufficient documentation

## 2013-09-25 DIAGNOSIS — K219 Gastro-esophageal reflux disease without esophagitis: Secondary | ICD-10-CM | POA: Insufficient documentation

## 2013-09-25 DIAGNOSIS — F3289 Other specified depressive episodes: Secondary | ICD-10-CM | POA: Insufficient documentation

## 2013-09-25 DIAGNOSIS — IMO0002 Reserved for concepts with insufficient information to code with codable children: Secondary | ICD-10-CM | POA: Insufficient documentation

## 2013-09-25 DIAGNOSIS — L299 Pruritus, unspecified: Secondary | ICD-10-CM | POA: Insufficient documentation

## 2013-09-25 DIAGNOSIS — Z79899 Other long term (current) drug therapy: Secondary | ICD-10-CM | POA: Insufficient documentation

## 2013-09-25 DIAGNOSIS — Z9981 Dependence on supplemental oxygen: Secondary | ICD-10-CM | POA: Insufficient documentation

## 2013-09-25 DIAGNOSIS — Z789 Other specified health status: Secondary | ICD-10-CM | POA: Insufficient documentation

## 2013-09-25 DIAGNOSIS — E785 Hyperlipidemia, unspecified: Secondary | ICD-10-CM | POA: Insufficient documentation

## 2013-09-25 DIAGNOSIS — Z96659 Presence of unspecified artificial knee joint: Secondary | ICD-10-CM | POA: Insufficient documentation

## 2013-09-25 DIAGNOSIS — Z7982 Long term (current) use of aspirin: Secondary | ICD-10-CM | POA: Insufficient documentation

## 2013-09-25 MED ORDER — PREDNISONE 10 MG PO TABS: 20.0000 mg | ORAL_TABLET | Freq: Every day | ORAL | Status: AC

## 2013-09-25 NOTE — ED Notes (Signed)
PT c/o itching skin x2 weeks. Blisters and some open areas noted to bilateral tops of toes. PT denies any pain at this time.

## 2013-09-25 NOTE — ED Notes (Signed)
MD at bedside. 

## 2013-09-25 NOTE — ED Provider Notes (Signed)
CSN: [id number]     Arrival date & time 09/25/13  1701 History   First MD Initiated Contact with Patient 09/25/13 1803     Chief Complaint  Patient presents with  . Toe Pain  . Pruritis     (Consider location/radiation/quality/duration/timing/severity/associated sxs/prior Treatment) Patient is a 68 y.o. female presenting with toe pain. The history is provided by the patient and a relative.  Toe Pain   patient here complaining of diffuse pruritus x2 weeks across her entire body worse in her feet. She notes some blistering x3 days which has started to rupture. Denies any intraoral involvement. No new chemical exposure. Has used soaks to her feet which is improved her symptoms. Denies any rashes. No fever. No new medications within the past month. Denies any recent antibiotic use. No recent use of medications with sulfur. Does have some pins and needles to her feet. This persistent and nothing makes them worse. No prior history of same.  Past Medical History  Diagnosis Date  . Hyperlipemia   . Sleep apnea     uses a cpap  . Diabetes mellitus without complication   . GERD (gastroesophageal reflux disease)   . Depression   . Neuropathy, peripheral   . Vertigo     chronic  . Wears glasses    Past Surgical History  Procedure Laterality Date  . Colectomy  1992    cancer  . Abdominal hysterectomy    . Appendectomy    . Dilation and curettage of uterus    . Breast surgery      cyst lt br-neg  . Knee arthroscopy  1995    left  . Open reduction internal fixation (orif) distal radial fracture Left 06/26/2012    Procedure: OPEN REDUCTION INTERNAL FIXATION (ORIF) LEFT DISTAL RADIAL FRACTURE;  Surgeon: Tami RibasKevin R Kuzma, MD;  Location: [facility];  Service: Orthopedics;  Laterality: Left;   No family history on file. History  Substance Use Topics  . Smoking status: Never Smoker   . Smokeless tobacco: Not on file  . Alcohol Use: No   OB History   Grav Para Term Preterm  Abortions TAB SAB Ect Mult Living                 Review of Systems  All other systems reviewed and are negative.     Allergies  Contrast media  Home Medications   Prior to Admission medications   Medication Sig Start Date End Date Taking? Authorizing Provider  aspirin 81 MG tablet Take 81 mg by mouth daily.    Historical Provider, MD  fluticasone (FLONASE) 50 MCG/ACT nasal spray Place 2 sprays into the nose daily.    Historical Provider, MD  fluticasone (FLOVENT HFA) 220 MCG/ACT inhaler Inhale 1 puff into the lungs 2 (two) times daily.    Historical Provider, MD  gabapentin (NEURONTIN) 100 MG capsule Take 100 mg by mouth 3 (three) times daily.    Historical Provider, MD  insulin aspart (NOVOLOG) 100 UNIT/ML injection Inject 10 Units into the skin 2 (two) times daily between meals.    Historical Provider, MD  insulin glargine (LANTUS) 100 UNIT/ML injection Inject 50 Units into the skin daily.    Historical Provider, MD  meclizine (ANTIVERT) 25 MG tablet Take 25 mg by mouth 4 (four) times daily as needed.    Historical Provider, MD  metFORMIN (GLUCOPHAGE) 500 MG tablet Take 500 mg by mouth daily with breakfast.    Historical Provider, MD  omeprazole (  PRILOSEC) 20 MG capsule Take 20 mg by mouth daily.    Historical Provider, MD  oxyCODONE-acetaminophen (PERCOCET) 5-325 MG per tablet 1-2 tabs po q6 hours prn pain 06/26/12   Tami RibasKevin R Kuzma, MD  predniSONE (DELTASONE) 10 MG tablet Take 2 tablets (20 mg total) by mouth daily. 09/25/13   Toy BakerAnthony T Taeja Debellis, MD  simvastatin (ZOCOR) 20 MG tablet Take 20 mg by mouth at bedtime.    Historical Provider, MD   BP 144/65  Pulse 84  Temp(Src) 98.2 F (36.8 C) (Oral)  Resp 18  Ht 5\' 2"  (1.575 m)  Wt 200 lb (90.719 kg)  BMI 36.57 kg/m2  SpO2 96% Physical Exam  Nursing note and vitals reviewed. Constitutional: She is oriented to person, place, and time. She appears well-developed and well-nourished.  Non-toxic appearance. No distress.  HENT:  Head:  Normocephalic and atraumatic.  No blisters on the patient's lips or intraoral  Eyes: Conjunctivae, EOM and lids are normal. Pupils are equal, round, and reactive to light.  Neck: Normal range of motion. Neck supple. No tracheal deviation present. No mass present.  Cardiovascular: Normal rate, regular rhythm and normal heart sounds.  Exam reveals no gallop.   No murmur heard. Pulmonary/Chest: Effort normal and breath sounds normal. No stridor. No respiratory distress. She has no decreased breath sounds. She has no wheezes. She has no rhonchi. She has no rales.  Abdominal: Soft. Normal appearance and bowel sounds are normal. She exhibits no distension. There is no tenderness. There is no rebound and no CVA tenderness.  Musculoskeletal: Normal range of motion. She exhibits no edema and no tenderness.  Neurological: She is alert and oriented to person, place, and time. She has normal strength. No cranial nerve deficit or sensory deficit. GCS eye subscore is 4. GCS verbal subscore is 5. GCS motor subscore is 6.  Skin: Skin is warm and dry. No abrasion and no rash noted.     Psychiatric: She has a normal mood and affect. Her speech is normal and behavior is normal.    ED Course  Procedures (including critical care time) Labs Review Labs Reviewed - No data to display  Imaging Review No results found.   EKG Interpretation None      MDM   Final diagnoses:  Allergic, initial encounter    Patient with likely allergic reaction we'll be treated with prednisone. He'll also have the patient apply topical antibiotic ointment to her feet. She called her Dr. tomorrow for insulin dosing instructions for his she is on prednisone.    Toy BakerAnthony T Chavon Lucarelli, MD 09/25/13 [phone]

## 2013-09-25 NOTE — ED Notes (Signed)
Pt reports itching all over body x 2 weeks.  Denies rash, says feels like pins sticking her.  Reports blisters started forming on toes a few days ago.  Denies injury or changing shoes.  Also has noticed that hair is falling out.

## 2013-09-25 NOTE — Discharge Instructions (Signed)
Use a triple antibiotic ointment to your feet twice a day. Continue with your soaks. Call your doctor tomorrow and tell him that you are on prednisone and need to have your insulin dose adjusted °Allergies ° Allergies may happen from anything your body is sensitive to. This may be food, medicines, pollens, chemicals, and many other things. Food allergies can be severe and deadly.  °HOME CARE °· If you do not know what causes a reaction, keep a diary. Write down the foods you ate and the symptoms that followed. Avoid foods that cause reactions. °· If you have red raised spots (hives) or a rash: °¨ Take medicine as told by your doctor. °¨ Use medicines for red raised spots and itching as needed. °¨ Apply cold cloths (compresses) to the skin. Take a cool bath. Avoid hot baths or showers. °· If you are severely allergic: °¨ It is often necessary to go to the hospital after you have treated your reaction. °¨ Wear your medical alert jewelry. °¨ You and your family must learn how to give a allergy shot or use an allergy kit (anaphylaxis kit). °¨ Always carry your allergy kit or shot with you. Use this medicine as told by your doctor if a severe reaction is occurring. °GET HELP RIGHT AWAY IF: °· You have trouble breathing or are making high-pitched whistling sounds (wheezing). °· You have a tight feeling in your chest or throat. °· You have a puffy (swollen) mouth. °· You have red raised spots, puffiness (swelling), or itching all over your body. °· You have had a severe reaction that was helped by your allergy kit or shot. The reaction can return once the medicine has worn off. °· You think you are having a food allergy. Symptoms most often happen within 30 minutes of eating a food. °· Your symptoms have not gone away within 2 days or are getting worse. °· You have new symptoms. °· You want to retest yourself with a food or drink you think causes an allergic reaction. Only do this under the care of a doctor. °MAKE SURE  YOU:  °· Understand these instructions. °· Will watch your condition. °· Will get help right away if you are not doing well or get worse. °Document Released: 07/02/2012 Document Reviewed: 07/02/2012 °ExitCare® Patient Information ©2015 [employer]. This information is not intended to replace advice given to you by your health care provider. Make sure you discuss any questions you have with your health care provider. °Contact Dermatitis °Contact dermatitis is a reaction to certain substances that touch the skin. Contact dermatitis can be either irritant contact dermatitis or allergic contact dermatitis. Irritant contact dermatitis does not require previous exposure to the substance for a reaction to occur. Allergic contact dermatitis only occurs if you have been exposed to the substance before. Upon a repeat exposure, your body reacts to the substance.  °CAUSES  °Many substances can cause contact dermatitis. Irritant dermatitis is most commonly caused by repeated exposure to mildly irritating substances, such as: °· Makeup. °· Soaps. °· Detergents. °· Bleaches. °· Acids. °· Metal salts, such as nickel. °Allergic contact dermatitis is most commonly caused by exposure to: °· Poisonous plants. °· Chemicals (deodorants, shampoos). °· Jewelry. °· Latex. °· Neomycin in triple antibiotic cream. °· Preservatives in products, including clothing. °SYMPTOMS  °The area of skin that is exposed may develop: °· Dryness or flaking. °· Redness. °· Cracks. °· Itching. °· Pain or a burning sensation. °· Blisters. °With allergic contact dermatitis, there may also   be swelling in areas such as the eyelids, mouth, or genitals.  °DIAGNOSIS  °Your caregiver can usually tell what the problem is by doing a physical exam. In cases where the cause is uncertain and an allergic contact dermatitis is suspected, a patch skin test may be performed to help determine the cause of your dermatitis. °TREATMENT °Treatment includes protecting the skin  from further contact with the irritating substance by avoiding that substance if possible. Barrier creams, powders, and gloves may be helpful. Your caregiver may also recommend: °· Steroid creams or ointments applied 2 times daily. For best results, soak the rash area in cool water for 20 minutes. Then apply the medicine. Cover the area with a plastic wrap. You can store the steroid cream in the refrigerator for a "chilly" effect on your rash. That may decrease itching. Oral steroid medicines may be needed in more severe cases. °· Antibiotics or antibacterial ointments if a skin infection is present. °· Antihistamine lotion or an antihistamine taken by mouth to ease itching. °· Lubricants to keep moisture in your skin. °· Burow's solution to reduce redness and soreness or to dry a weeping rash. Mix one packet or tablet of solution in 2 cups cool water. Dip a clean washcloth in the mixture, wring it out a bit, and put it on the affected area. Leave the cloth in place for 30 minutes. Do this as often as possible throughout the day. °· Taking several cornstarch or baking soda baths daily if the area is too large to cover with a washcloth. °Harsh chemicals, such as alkalis or acids, can cause skin damage that is like a burn. You should flush your skin for 15 to 20 minutes with cold water after such an exposure. You should also seek immediate medical care after exposure. Bandages (dressings), antibiotics, and pain medicine may be needed for severely irritated skin.  °HOME CARE INSTRUCTIONS °· Avoid the substance that caused your reaction. °· Keep the area of skin that is affected away from hot water, soap, sunlight, chemicals, acidic substances, or anything else that would irritate your skin. °· Do not scratch the rash. Scratching may cause the rash to become infected. °· You may take cool baths to help stop the itching. °· Only take over-the-counter or prescription medicines as directed by your caregiver. °· See your  caregiver for follow-up care as directed to make sure your skin is healing properly. °SEEK MEDICAL CARE IF:  °· Your condition is not better after 3 days of treatment. °· You seem to be getting worse. °· You see signs of infection such as swelling, tenderness, redness, soreness, or warmth in the affected area. °· You have any problems related to your medicines. °Document Released: 03/04/2000 Document Revised: 05/30/2011 Document Reviewed: 08/10/2010 °ExitCare® Patient Information ©2015 [employer]. This information is not intended to replace advice given to you by your health care provider. Make sure you discuss any questions you have with your health care provider. ° °

## 2015-02-09 DIAGNOSIS — G4733 Obstructive sleep apnea (adult) (pediatric): Secondary | ICD-10-CM | POA: Insufficient documentation

## 2015-11-12 DIAGNOSIS — C50811 Malignant neoplasm of overlapping sites of right female breast: Secondary | ICD-10-CM | POA: Insufficient documentation

## 2016-11-14 DIAGNOSIS — R42 Dizziness and giddiness: Secondary | ICD-10-CM | POA: Insufficient documentation

## 2017-09-11 ENCOUNTER — Emergency Department (HOSPITAL_COMMUNITY): Payer: Medicare Other

## 2017-09-11 ENCOUNTER — Encounter (HOSPITAL_COMMUNITY): Payer: Self-pay

## 2017-09-11 ENCOUNTER — Emergency Department (HOSPITAL_COMMUNITY)
Admission: EM | Admit: 2017-09-11 | Discharge: 2017-09-12 | Disposition: A | Discharge status: Home / Self Care | Payer: Medicare Other | Attending: Emergency Medicine | Admitting: Emergency Medicine

## 2017-09-11 DIAGNOSIS — Z794 Long term (current) use of insulin: Secondary | ICD-10-CM | POA: Diagnosis not present

## 2017-09-11 DIAGNOSIS — Z7982 Long term (current) use of aspirin: Secondary | ICD-10-CM | POA: Diagnosis not present

## 2017-09-11 DIAGNOSIS — R911 Solitary pulmonary nodule: Secondary | ICD-10-CM | POA: Insufficient documentation

## 2017-09-11 DIAGNOSIS — E1165 Type 2 diabetes mellitus with hyperglycemia: Secondary | ICD-10-CM | POA: Diagnosis present

## 2017-09-11 DIAGNOSIS — Z79899 Other long term (current) drug therapy: Secondary | ICD-10-CM | POA: Diagnosis not present

## 2017-09-11 DIAGNOSIS — R739 Hyperglycemia, unspecified: Secondary | ICD-10-CM

## 2017-09-11 LAB — CBC
HCT: 42.7 % (ref 36.0–46.0)
Hemoglobin: 13.9 g/dL (ref 12.0–15.0)
MCH: 29 pg (ref 26.0–34.0)
MCHC: 32.6 g/dL (ref 30.0–36.0)
MCV: 89 fL (ref 78.0–100.0)
PLATELETS: 243 10*3/uL (ref 150–400)
RBC: 4.8 MIL/uL (ref 3.87–5.11)
RDW: 12.2 % (ref 11.5–15.5)
WBC: 5.6 10*3/uL (ref 4.0–10.5)

## 2017-09-11 LAB — URINALYSIS, ROUTINE W REFLEX MICROSCOPIC
BILIRUBIN URINE: NEGATIVE
Bacteria, UA: NONE SEEN
Glucose, UA: >=500 mg/dL — AB
HGB URINE DIPSTICK: NEGATIVE
KETONES UR: 20 mg/dL — AB
Leukocytes, UA: NEGATIVE
Nitrite: NEGATIVE
Protein, ur: NEGATIVE mg/dL
Specific Gravity, Urine: 1.035 — ABNORMAL HIGH (ref 1.005–1.030)
pH: 5 (ref 5.0–8.0)

## 2017-09-11 LAB — BASIC METABOLIC PANEL
Anion gap: 11 (ref 5–15)
BUN: 13 mg/dL (ref 6–20)
CALCIUM: 9.2 mg/dL (ref 8.9–10.3)
CO2: 26 mmol/L (ref 22–32)
CREATININE: 0.69 mg/dL (ref 0.44–1.00)
Chloride: 96 mmol/L — ABNORMAL LOW (ref 101–111)
GFR calc Af Amer: >60 mL/min (ref >60)
GFR calc non Af Amer: >60 mL/min (ref >60)
GLUCOSE: 405 mg/dL — AB (ref 65–99)
Potassium: 4.2 mmol/L (ref 3.5–5.1)
Sodium: 133 mmol/L — ABNORMAL LOW (ref 135–145)

## 2017-09-11 LAB — CBG MONITORING, ED
GLUCOSE-CAPILLARY: 345 mg/dL — AB (ref 65–99)
Glucose-Capillary: 393 mg/dL — ABNORMAL HIGH (ref 65–99)

## 2017-09-11 MED ORDER — SODIUM CHLORIDE 0.9 % IV BOLUS
1000.0000 mL | Freq: Once | INTRAVENOUS | Status: AC
  Administered 2017-09-11: 1000 mL via INTRAVENOUS

## 2017-09-11 NOTE — ED Triage Notes (Addendum)
Pt reports blood sugar was over 400 yesterday and by the evening in was in the 300's. Pt states blood sugar is still in 300's and feels weak. Reports leg and back pain as well. CBG 393

## 2017-09-11 NOTE — Discharge Instructions (Signed)
You were evaluated in the emergency department for 1 month of elevated blood sugars.  We did not find an obvious cause of why your sugar was up but it likely has to do with the new medication you are prescribed for diabetes.  Please call your endocrinologist so they can evaluate you and decide whether you need a medication adjustment.  You also had a chest x-ray which found a lung nodule.  This will need some follow-up testing by your doctor.  Enclosed is the report of the x-ray:    IMPRESSION:  6 mm nodular density at the left lung base. This has the appearance  of a granuloma but is not conclusive radiographically. This can be  correlated with nonemergent chest CT for baseline assessment and  follow-up recommendations. No acute pulmonary consolidation or CHF.

## 2017-09-11 NOTE — ED Provider Notes (Signed)
[facility] EMERGENCY DEPARTMENT Provider Note   CSN: [id number] Arrival date & time: 09/11/17  1610     History   Chief Complaint Chief Complaint  Patient presents with  . Hyperglycemia    HPI Adriana ShipperMargaret P Wiley is a 72 y.o. female.  She has history of diabetes.  She is complaining of elevated blood sugars in the 3 and 400s for about a month.  She has an endocrinologist and about 2 months ago he had to change 1 of her medications.  When I asked her why she did not call him with his elevated blood sugar she said she did not think of it.  She is noticed some generalized fatigue and some increased pain in her left hip.  There is been no falls.  She denies any cough shortness of breath abdominal pain nausea vomiting diarrhea fevers or chills.  He also denies any urinary symptoms.  The history is provided by the patient.  Hyperglycemia  Blood sugar level PTA:  400s Severity:  Moderate Onset quality:  Gradual Timing:  Constant Progression:  Unchanged Chronicity:  New Diabetes status:  Controlled with insulin Context: change in medication   Relieved by:  None tried Ineffective treatments:  None tried Associated symptoms: fatigue, malaise and weakness   Associated symptoms: no abdominal pain, no altered mental status, no blurred vision, no chest pain, no confusion, no dehydration, no diaphoresis, no dizziness, no dysuria, no fever, no increased appetite, no increased thirst, no shortness of breath, no syncope and no vomiting     Past Medical History:  Diagnosis Date  . Depression   . Diabetes mellitus without complication (HCC)   . GERD (gastroesophageal reflux disease)   . Hyperlipemia   . Neuropathy, peripheral   . Sleep apnea    uses a cpap  . Vertigo    chronic  . Wears glasses     There are no active problems to display for this patient.   Past Surgical History:  Procedure Laterality Date  . ABDOMINAL HYSTERECTOMY    . APPENDECTOMY    . BREAST SURGERY     cyst lt  br-neg  . COLECTOMY  1992   cancer  . DILATION AND CURETTAGE OF UTERUS    . KNEE ARTHROSCOPY  1995   left  . OPEN REDUCTION INTERNAL FIXATION (ORIF) DISTAL RADIAL FRACTURE Left 06/26/2012   Procedure: OPEN REDUCTION INTERNAL FIXATION (ORIF) LEFT DISTAL RADIAL FRACTURE;  Surgeon: Tami RibasKevin R Kuzma, MD;  Location: [facility];  Service: Orthopedics;  Laterality: Left;     OB History   None      Home Medications    Prior to Admission medications   Medication Sig Start Date End Date Taking? Authorizing Provider  aspirin 81 MG tablet Take 81 mg by mouth daily.    [provider]  fluticasone (FLONASE) 50 MCG/ACT nasal spray Place 2 sprays into the nose daily.    [provider]  fluticasone (FLOVENT HFA) 220 MCG/ACT inhaler Inhale 1 puff into the lungs 2 (two) times daily.    [provider]  gabapentin (NEURONTIN) 100 MG capsule Take 100 mg by mouth 3 (three) times daily.    [provider]  insulin aspart (NOVOLOG) 100 UNIT/ML injection Inject 10 Units into the skin 2 (two) times daily between meals.    [provider]  insulin glargine (LANTUS) 100 UNIT/ML injection Inject 50 Units into the skin daily.    [provider]  meclizine (ANTIVERT) 25 MG tablet  Take 25 mg by mouth 4 (four) times daily as needed.    [provider]  metFORMIN (GLUCOPHAGE) 500 MG tablet Take 500 mg by mouth daily with breakfast.    [provider]  omeprazole (PRILOSEC) 20 MG capsule Take 20 mg by mouth daily.    [provider]  oxyCODONE-acetaminophen (PERCOCET) 5-325 MG per tablet 1-2 tabs po q6 hours prn pain 06/26/12   Betha Loa, MD  predniSONE (DELTASONE) 10 MG tablet Take 2 tablets (20 mg total) by mouth daily. 09/25/13   Lorre Nick, MD  simvastatin (ZOCOR) 20 MG tablet Take 20 mg by mouth at bedtime.    [provider]    Family History No family history on file.  Social History Social History    Tobacco Use  . Smoking status: Never Smoker  Substance Use Topics  . Alcohol use: No  . Drug use: No     Allergies   Metrizamide and Contrast media [iodinated diagnostic agents]   Review of Systems Review of Systems  Constitutional: Positive for fatigue. Negative for diaphoresis and fever.  HENT: Negative for sore throat.   Eyes: Negative for blurred vision, pain and visual disturbance.  Respiratory: Negative for shortness of breath.   Cardiovascular: Negative for chest pain and syncope.  Gastrointestinal: Negative for abdominal pain and vomiting.  Endocrine: Negative for polydipsia.  Genitourinary: Negative for dysuria and frequency.  Musculoskeletal: Negative for back pain and neck pain.  Skin: Negative for rash.  Neurological: Positive for weakness. Negative for dizziness.  Psychiatric/Behavioral: Negative for confusion.     Physical Exam Updated Vital Signs BP (!) 150/101 (BP Location: Right Arm)   Pulse 93   Temp 98.4 F (36.9 C) (Oral)   Resp 16   Wt 80.7 kg (178 lb)   SpO2 95%   BMI 32.56 kg/m   Physical Exam  Constitutional: She appears well-developed and well-nourished. No distress.  HENT:  Head: Normocephalic and atraumatic.  Eyes: Conjunctivae are normal.  Neck: Neck supple.  Cardiovascular: Normal rate, regular rhythm, normal heart sounds and intact distal pulses.  No murmur heard. Pulmonary/Chest: Effort normal and breath sounds normal. No respiratory distress.  Abdominal: Soft. There is no tenderness.  Musculoskeletal: Normal range of motion. She exhibits no edema, tenderness or deformity.  Neurological: She is alert.  Skin: Skin is warm and dry. Capillary refill takes less than 2 seconds.  Psychiatric: She has a normal mood and affect.  Nursing note and vitals reviewed.    ED Treatments / Results  Labs (all labs ordered are listed, but only abnormal results are displayed) Labs Reviewed  BASIC METABOLIC PANEL - Abnormal; Notable for the  following components:      Result Value   Sodium 133 (*)    Chloride 96 (*)    Glucose, Bld 405 (*)    All other components within normal limits  CBG MONITORING, ED - Abnormal; Notable for the following components:   Glucose-Capillary 393 (*)    All other components within normal limits  CBG MONITORING, ED - Abnormal; Notable for the following components:   Glucose-Capillary 345 (*)    All other components within normal limits  CBC  URINALYSIS, ROUTINE W REFLEX MICROSCOPIC    EKG EKG Interpretation  Date/Time:  Monday September 11 2017 23:16:31 EDT Ventricular Rate:  84 PR Interval:    QRS Duration: 78 QT Interval:  412 QTC Calculation: 487 R Axis:   26 Text Interpretation:  Sinus rhythm LVH by voltage Borderline prolonged  QT interval Baseline wander in lead(s) V1 t wave flattening more pronounced than prior July 07, 2022 Confirmed by Meridee Score [phone]) on 09/11/2017 11:20:02 PM   Radiology Dg Chest 2 View  Result Date: 09/11/2017 CLINICAL DATA:  Generalized weakness and increased left hip pain without known injury. EXAM: CHEST - 2 VIEW COMPARISON:  11/21/2009 FINDINGS: The heart size and mediastinal contours are within normal limits. Slight uncoiling of the thoracic aorta. No pulmonary consolidation. 6 mm nodular density at the left lung base is noted. Mild interstitial prominence of the lungs. No CHF, effusion or pneumothorax. Surgical clips project over the right breast shadow. The visualized skeletal structures are unremarkable. IMPRESSION: 6 mm nodular density at the left lung base. This has the appearance of a granuloma but is not conclusive radiographically. This can be correlated with nonemergent chest CT for baseline assessment and follow-up recommendations. No acute pulmonary consolidation or CHF. Electronically Signed   By: Tollie Eth M.D.   On: 09/11/2017 23:05   Dg Hip Unilat With Pelvis 2-3 Views Left  Result Date: 09/11/2017 CLINICAL DATA:  Generalized weakness and left hip  pain without known injury. EXAM: DG HIP (WITH OR WITHOUT PELVIS) 2-3V LEFT COMPARISON:  None. FINDINGS: Mild joint space narrowing of both hips. Well corticated ossifications are seen on the frog-leg view that may reflect gluteal tendinopathy. Femoral heads are spherical in appearance without flattening. No fracture of either femur. Bony pelvis appears intact. The included lumbar spine is unremarkable. Intact arcuate lines of the sacrum. No pelvic diastasis. Soft tissues are unremarkable. Moderate fecal retention within the included colon. At least 3 phleboliths are present in the left hemipelvis. IMPRESSION: No acute osseous abnormality. Ossific densities adjacent to the greater trochanter on the frogleg view may reflect gluteal tendinopathy. Electronically Signed   By: Tollie Eth M.D.   On: 09/11/2017 23:06    Procedures Procedures (including critical care time)  Medications Ordered in ED Medications  sodium chloride 0.9 % bolus 1,000 mL (has no administration in time range)     Initial Impression / Assessment and Plan / ED Course  I have reviewed the triage vital signs and the nursing notes.  Pertinent labs & imaging results that were available during my care of the patient were reviewed by me and considered in my medical decision making (see chart for details).  Clinical Course as of Sep 13 1109  Mon Sep 11, 2017  2227 Patient with elevated blood sugars with the only because it can be identified is that her doctor had switched her medications about 2 months ago.  She is feeling a little fatigue but otherwise no true illness symptoms.  Check a urine chest x-ray.  She was complaining some vague left groin and pelvic pain so we will get an x-ray.   [MB]  2336 Updated the patient on results of her labs and imaging.  I let her know about this lung nodule and the need for follow-up she seems to understand.  He is getting some IV fluids we will check another fingerstick and she can be discharged  to follow-up with her endocrinologist.   [MB]    Clinical Course User Index [MB] Terrilee Files, MD     Final Clinical Impressions(s) / ED Diagnoses   Final diagnoses:  Hyperglycemia  Lung nodule    ED Discharge Orders    None       Terrilee Files, MD 09/13/17 1112

## 2017-09-12 DIAGNOSIS — E1165 Type 2 diabetes mellitus with hyperglycemia: Secondary | ICD-10-CM | POA: Diagnosis not present

## 2017-09-12 LAB — CBG MONITORING, ED: Glucose-Capillary: 299 mg/dL — ABNORMAL HIGH (ref 70–99)

## 2017-09-12 NOTE — ED Provider Notes (Signed)
Care assumed from Dr. Charm BargesButler.  Patient awaiting IV fluids for hyperglycemia.  Her anion gap is normal.  Sugar 299 on recheck and patient requesting discharge.   Adriana Wiley, Jerrett Baldinger, MD 09/12/17 [phone]

## 2018-11-02 DIAGNOSIS — K219 Gastro-esophageal reflux disease without esophagitis: Secondary | ICD-10-CM | POA: Insufficient documentation

## 2018-11-02 DIAGNOSIS — F3341 Major depressive disorder, recurrent, in partial remission: Secondary | ICD-10-CM | POA: Insufficient documentation

## 2019-02-18 IMAGING — DX DG HIP (WITH OR WITHOUT PELVIS) 2-3V*L*
3 series · 3 of 3 positions shown · non-contrast
Comparison: None.

CLINICAL DATA: Generalized weakness and left hip pain without known
injury.

EXAM:
DG HIP (WITH OR WITHOUT PELVIS) 2-3V LEFT

[pelvis ap]
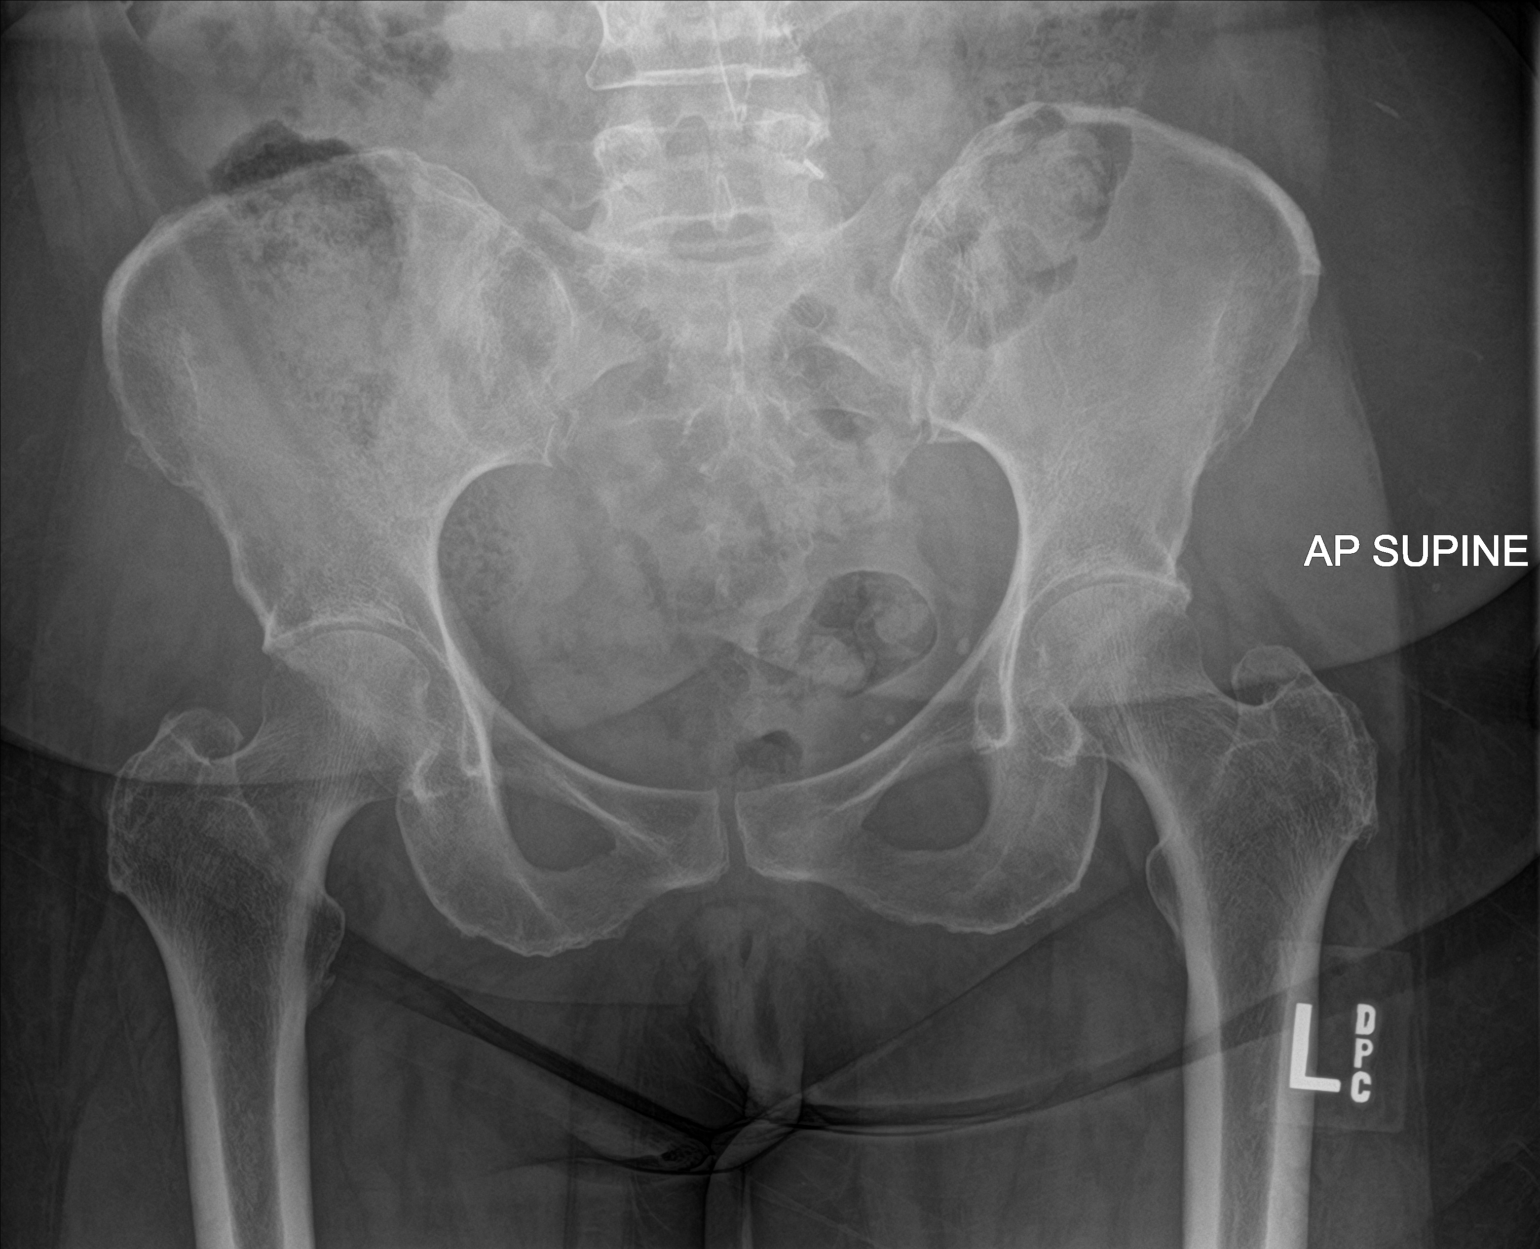

[hip ap]
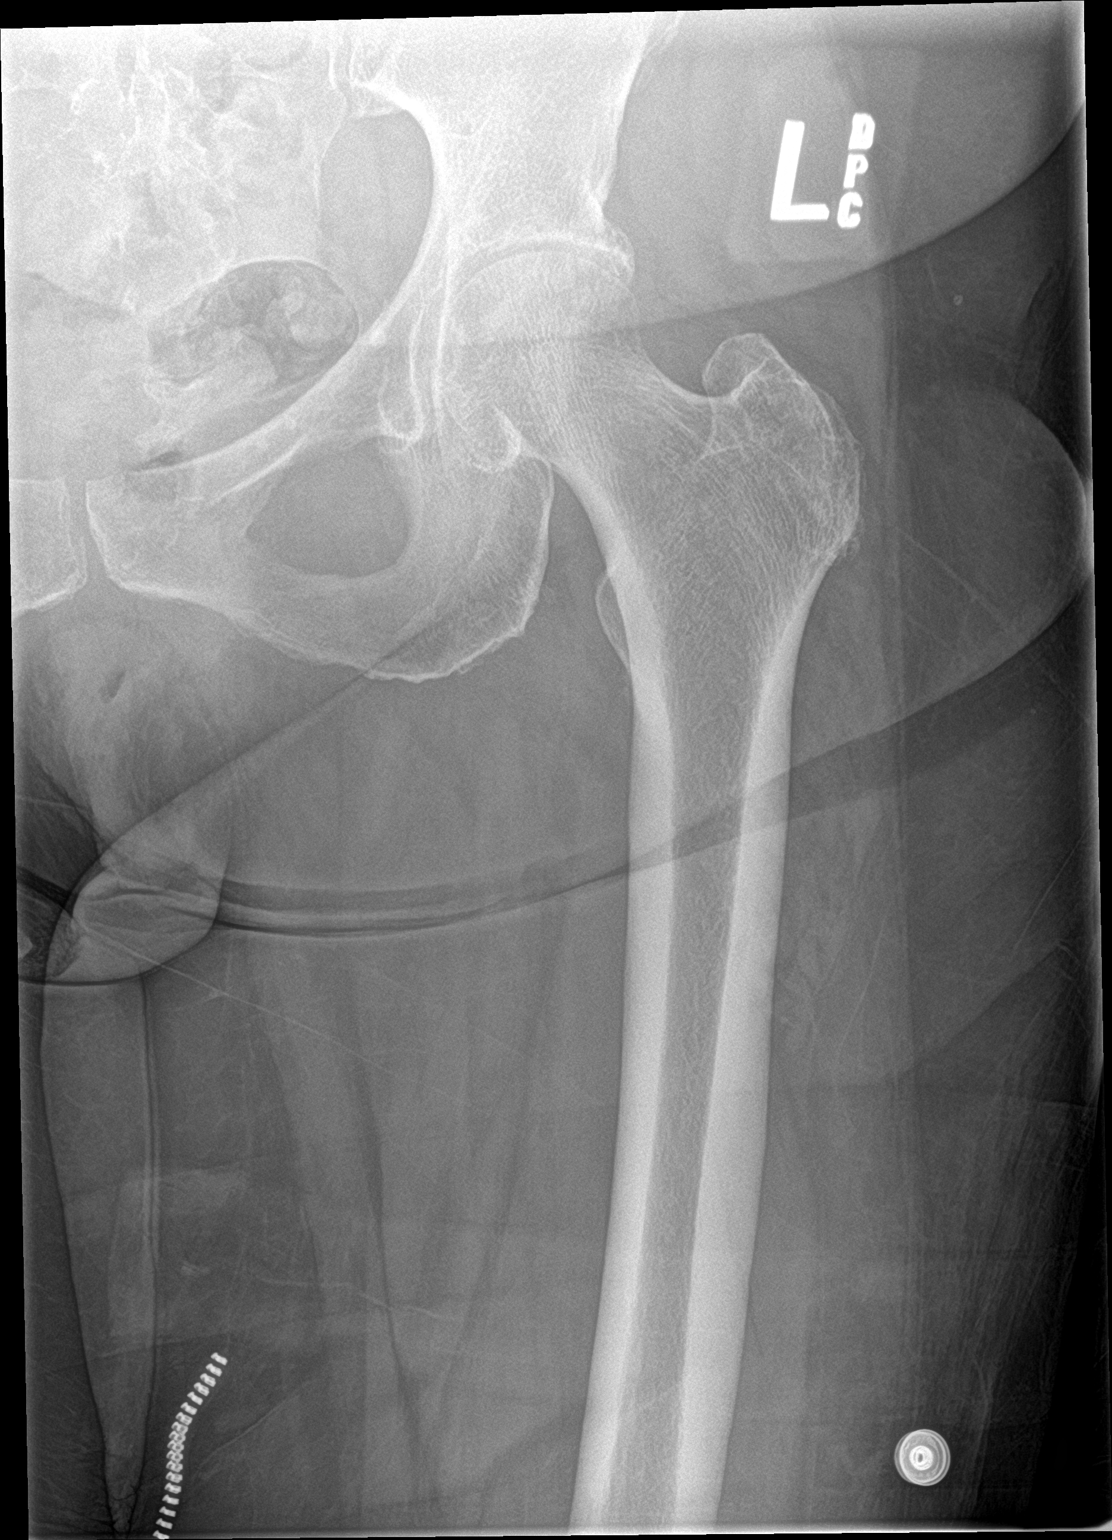

[hip lat]
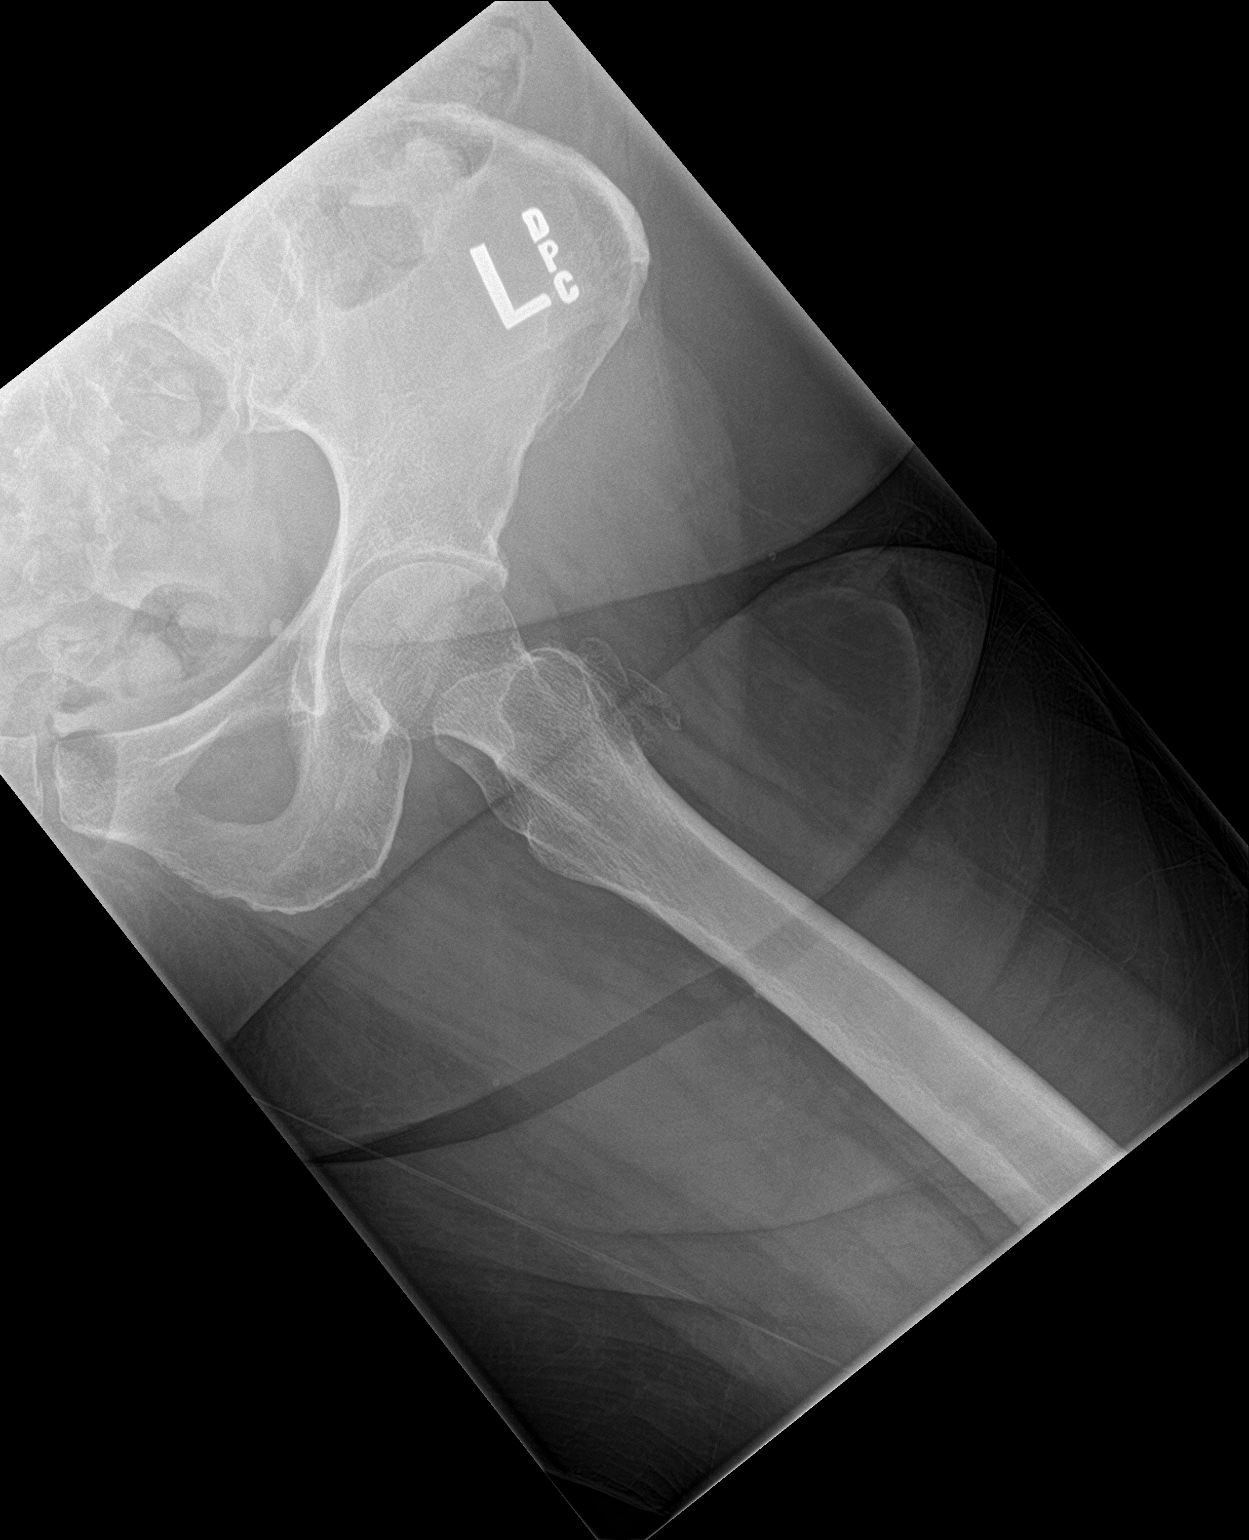

[3 of 3 positions shown; findings below may reference images not displayed]

FINDINGS: Mild joint space narrowing of both hips. Well corticated
ossifications are seen on the frog-leg view that may reflect gluteal
tendinopathy. Femoral heads are spherical in appearance without
flattening. No fracture of either femur. Bony pelvis appears intact.
The included lumbar spine is unremarkable. Intact arcuate lines of
the sacrum. No pelvic diastasis. Soft tissues are unremarkable.
Moderate fecal retention within the included colon. At least 3
phleboliths are present in the left hemipelvis.
IMPRESSION: No acute osseous abnormality. Ossific densities adjacent to the
greater trochanter on the frogleg view may reflect gluteal
tendinopathy.

## 2019-02-18 IMAGING — DX DG CHEST 2V
2 series · 2 of 2 positions shown · non-contrast
Comparison: 11/21/2009

CLINICAL DATA: Generalized weakness and increased left hip pain
without known injury.

EXAM:
CHEST - 2 VIEW

[chest pa]
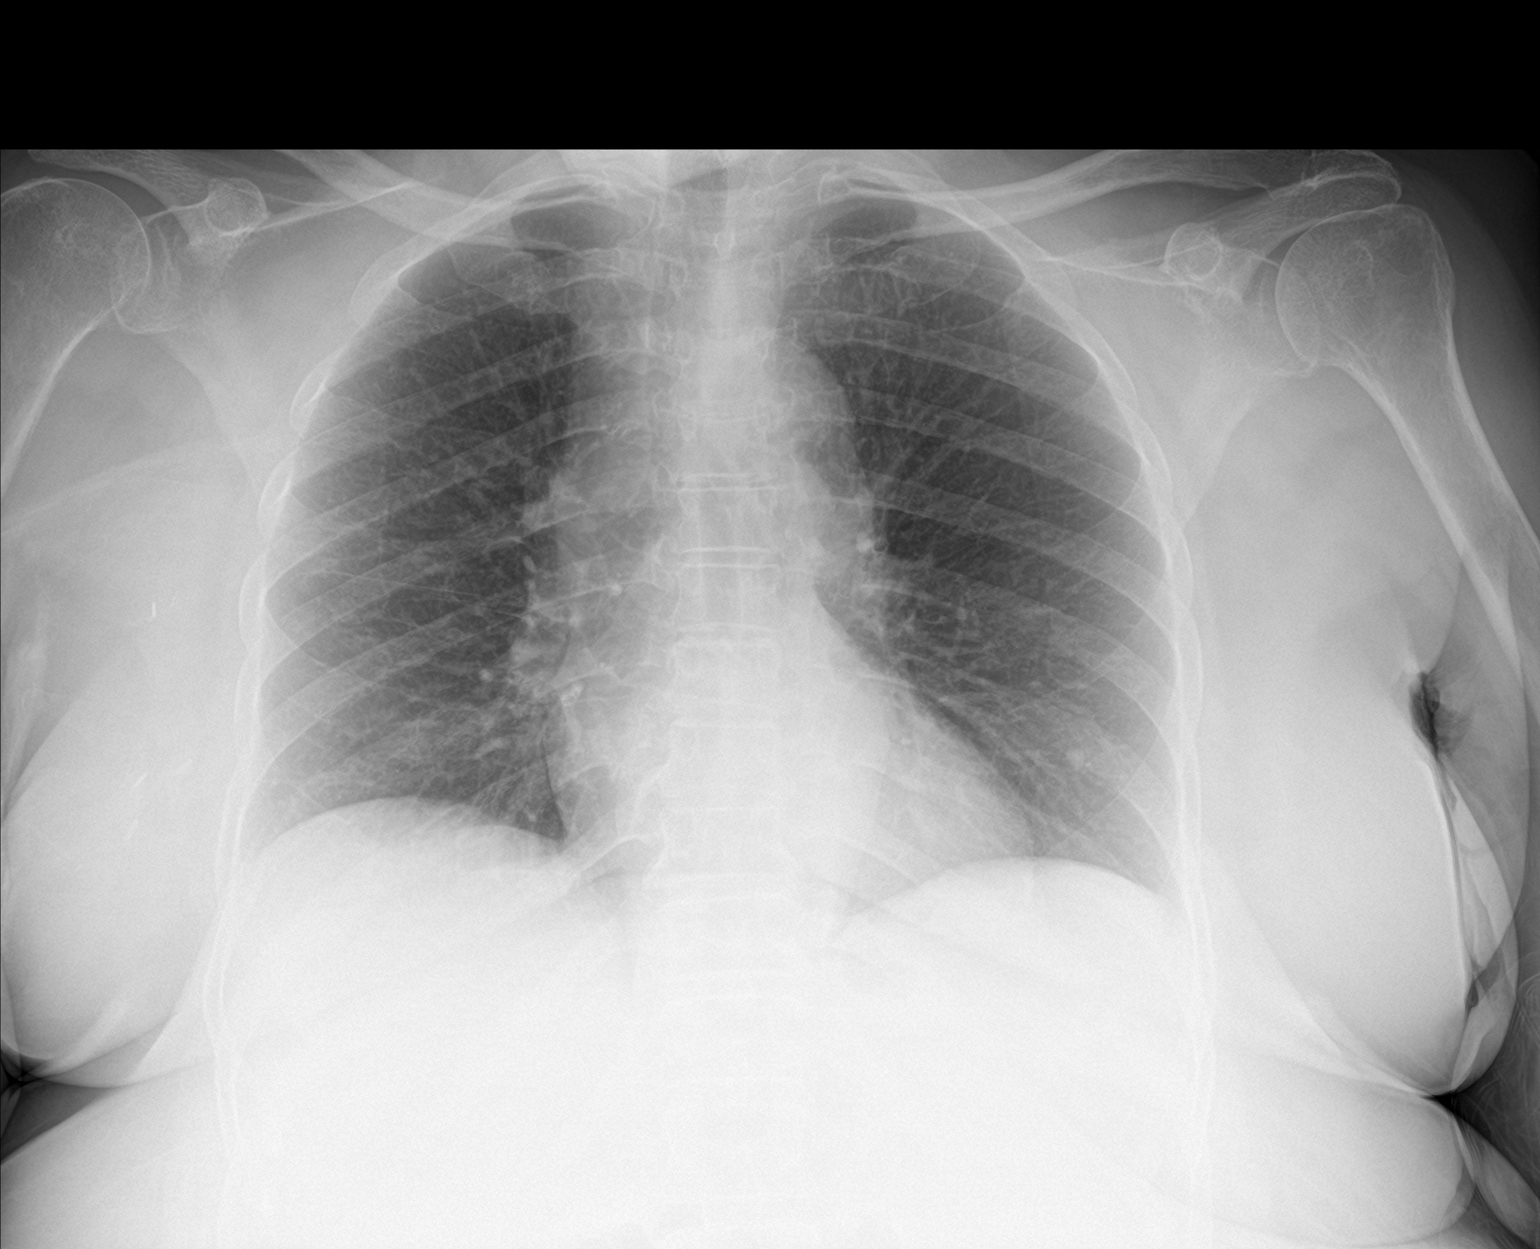

[chest lat]
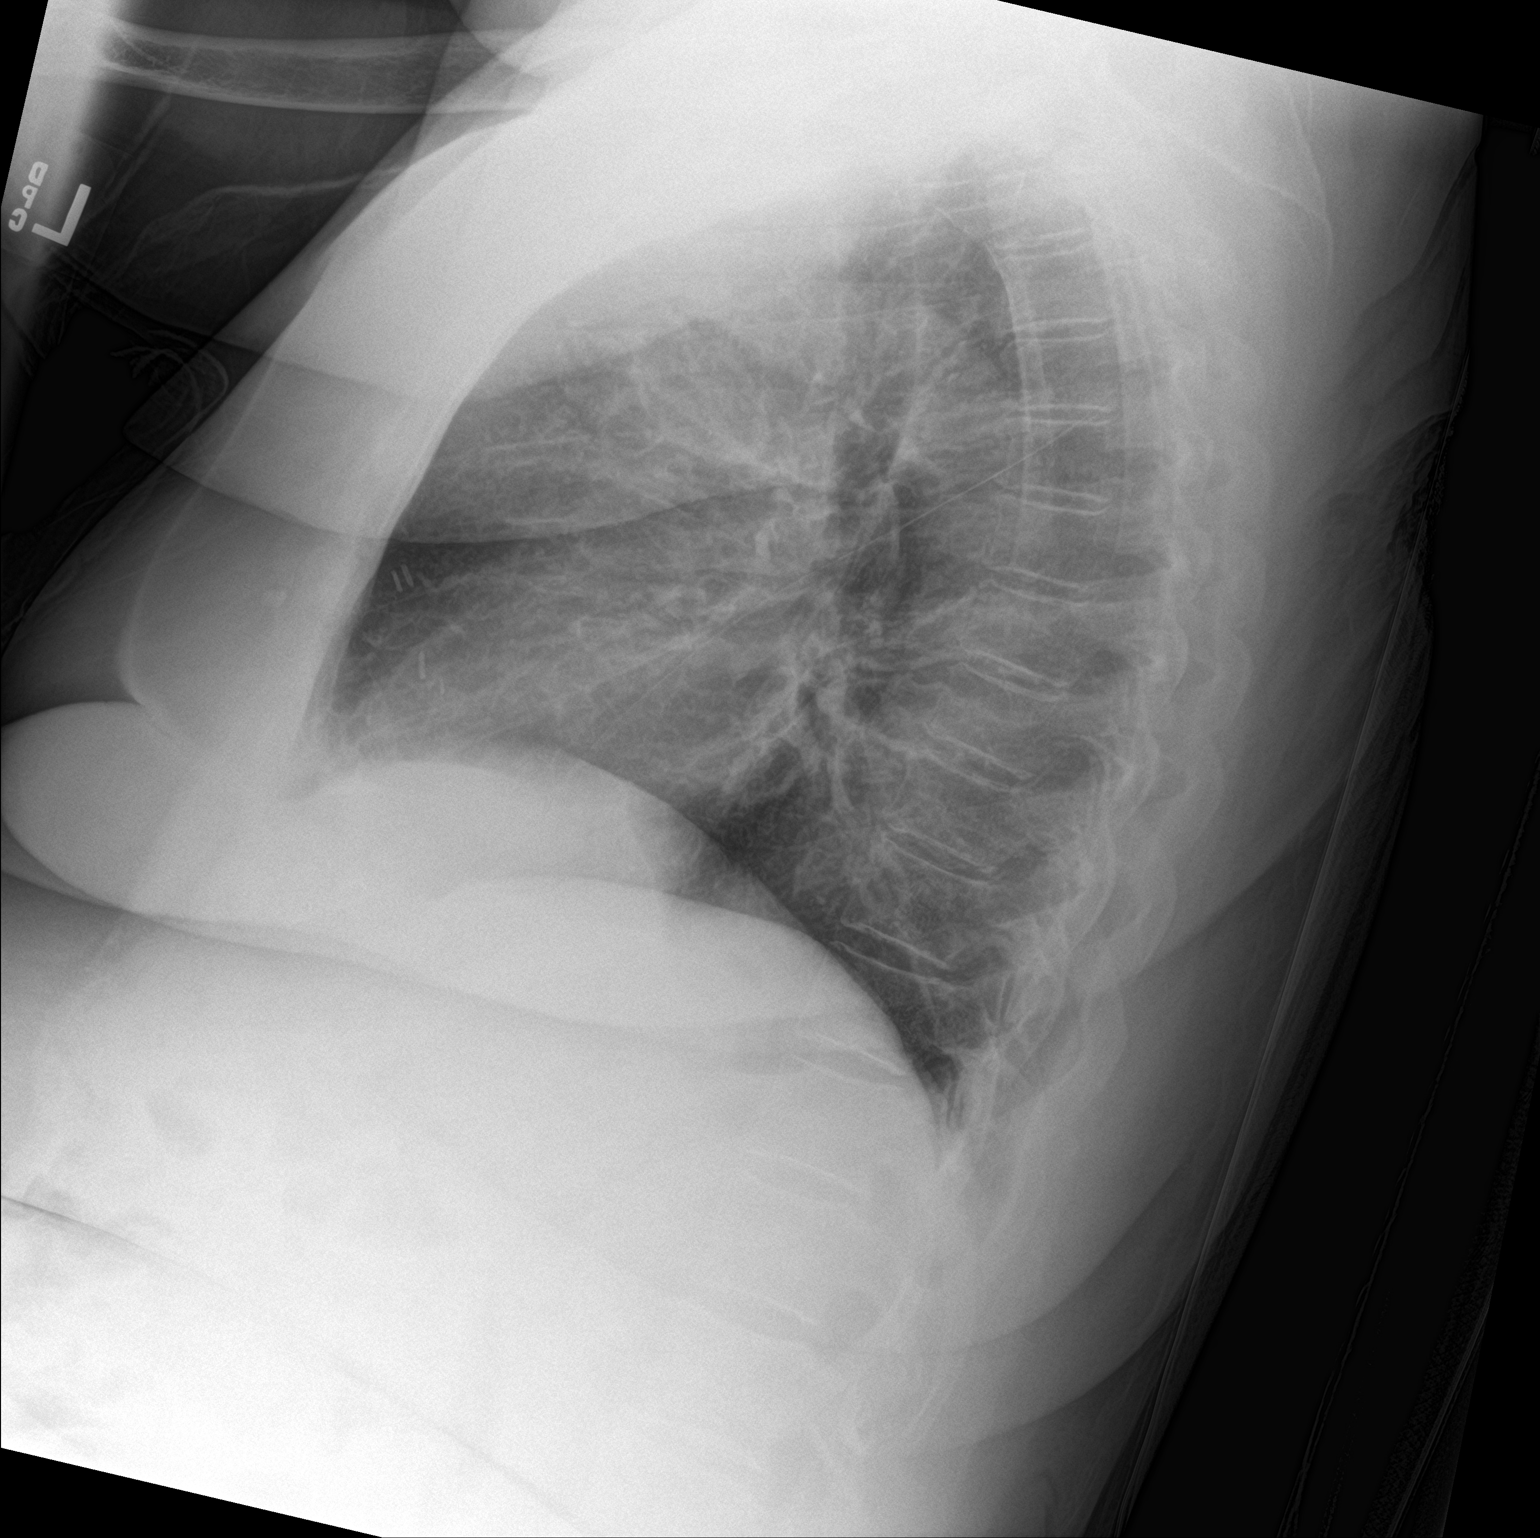

[2 of 2 positions shown; findings below may reference images not displayed]

FINDINGS: The heart size and mediastinal contours are within normal limits.
Slight uncoiling of the thoracic aorta. No pulmonary consolidation.
6 mm nodular density at the left lung base is noted. Mild
interstitial prominence of the lungs. No CHF, effusion or
pneumothorax. Surgical clips project over the right breast shadow.
The visualized skeletal structures are unremarkable.
IMPRESSION: 6 mm nodular density at the left lung base. This has the appearance
of a granuloma but is not conclusive radiographically. This can be
correlated with nonemergent chest CT for baseline assessment and
follow-up recommendations. No acute pulmonary consolidation or CHF.

## 2019-06-10 ENCOUNTER — Telehealth: Payer: Self-pay | Admitting: Orthopedic Surgery

## 2019-06-10 NOTE — Telephone Encounter (Signed)
Voice message received from patient requesting to schedule appointment with Dr Romeo Apple - call returned; left message, although unsure if voice mail took the message. Will try back.

## 2019-06-11 NOTE — Telephone Encounter (Signed)
Ms. Dragoo called back this morning.  She said she had a problem with her hands.  When I asked if she had been seen somewhere else for this problem, she said she has.  She said she has been seeing a doctor in [place] but her family has tried to talk her into coming to our office because it is closer.  I told her that we would need for her to get her records from the doctor in [facility] for Dr. Romeo Apple to review before we could make an appointment.  She said she would do this.

## 2020-02-09 DIAGNOSIS — I10 Essential (primary) hypertension: Secondary | ICD-10-CM | POA: Insufficient documentation

## 2021-04-07 DIAGNOSIS — E113291 Type 2 diabetes mellitus with mild nonproliferative diabetic retinopathy without macular edema, right eye: Secondary | ICD-10-CM | POA: Insufficient documentation

## 2021-09-30 DIAGNOSIS — I471 Supraventricular tachycardia, unspecified: Secondary | ICD-10-CM | POA: Insufficient documentation

## 2022-12-06 ENCOUNTER — Other Ambulatory Visit: Payer: Self-pay

## 2022-12-06 ENCOUNTER — Emergency Department (HOSPITAL_COMMUNITY)
Admission: EM | Admit: 2022-12-06 | Discharge: 2022-12-06 | Disposition: A | Discharge status: Home / Self Care | Payer: Medicare Other | Attending: Student | Admitting: Student

## 2022-12-06 ENCOUNTER — Encounter (HOSPITAL_COMMUNITY): Payer: Self-pay | Admitting: *Deleted

## 2022-12-06 ENCOUNTER — Emergency Department (HOSPITAL_COMMUNITY): Payer: Medicare Other

## 2022-12-06 DIAGNOSIS — Z7984 Long term (current) use of oral hypoglycemic drugs: Secondary | ICD-10-CM | POA: Insufficient documentation

## 2022-12-06 DIAGNOSIS — N3001 Acute cystitis with hematuria: Secondary | ICD-10-CM | POA: Diagnosis not present

## 2022-12-06 DIAGNOSIS — Z7982 Long term (current) use of aspirin: Secondary | ICD-10-CM | POA: Diagnosis not present

## 2022-12-06 DIAGNOSIS — Z794 Long term (current) use of insulin: Secondary | ICD-10-CM | POA: Insufficient documentation

## 2022-12-06 DIAGNOSIS — E876 Hypokalemia: Secondary | ICD-10-CM | POA: Insufficient documentation

## 2022-12-06 DIAGNOSIS — R1031 Right lower quadrant pain: Secondary | ICD-10-CM | POA: Diagnosis present

## 2022-12-06 DIAGNOSIS — E119 Type 2 diabetes mellitus without complications: Secondary | ICD-10-CM | POA: Diagnosis not present

## 2022-12-06 DIAGNOSIS — R1030 Lower abdominal pain, unspecified: Secondary | ICD-10-CM

## 2022-12-06 LAB — COMPREHENSIVE METABOLIC PANEL
ALT: 13 U/L (ref 0–44)
AST: 18 U/L (ref 15–41)
Albumin: 4 g/dL (ref 3.5–5.0)
Alkaline Phosphatase: 82 U/L (ref 38–126)
Anion gap: 10 (ref 5–15)
BUN: 9 mg/dL (ref 8–23)
CO2: 28 mmol/L (ref 22–32)
Calcium: 9 mg/dL (ref 8.9–10.3)
Chloride: 101 mmol/L (ref 98–111)
Creatinine, Ser: 0.63 mg/dL (ref 0.44–1.00)
GFR, Estimated: >60 mL/min (ref >60)
Glucose, Bld: 83 mg/dL (ref 70–99)
Potassium: 3.4 mmol/L — ABNORMAL LOW (ref 3.5–5.1)
Sodium: 139 mmol/L (ref 135–145)
Total Bilirubin: 0.5 mg/dL (ref 0.3–1.2)
Total Protein: 7.4 g/dL (ref 6.5–8.1)

## 2022-12-06 LAB — URINALYSIS, ROUTINE W REFLEX MICROSCOPIC
Bilirubin Urine: NEGATIVE
Glucose, UA: NEGATIVE mg/dL
Hgb urine dipstick: NEGATIVE
Ketones, ur: NEGATIVE mg/dL
Nitrite: NEGATIVE
Protein, ur: NEGATIVE mg/dL
Specific Gravity, Urine: 1.017 (ref 1.005–1.030)
pH: 5 (ref 5.0–8.0)

## 2022-12-06 LAB — CBC
HCT: 40.7 % (ref 36.0–46.0)
Hemoglobin: 12.3 g/dL (ref 12.0–15.0)
MCH: 27.6 pg (ref 26.0–34.0)
MCHC: 30.2 g/dL (ref 30.0–36.0)
MCV: 91.5 fL (ref 80.0–100.0)
Platelets: 260 10*3/uL (ref 150–400)
RBC: 4.45 MIL/uL (ref 3.87–5.11)
RDW: 14.8 % (ref 11.5–15.5)
WBC: 6 10*3/uL (ref 4.0–10.5)
nRBC: 0 % (ref 0.0–0.2)

## 2022-12-06 LAB — LIPASE, BLOOD: Lipase: 28 U/L (ref 11–51)

## 2022-12-06 MED ORDER — CEFADROXIL 500 MG PO CAPS: 500.0000 mg | ORAL_CAPSULE | Freq: Two times a day (BID) | ORAL | 0 refills | Status: AC

## 2022-12-06 MED ORDER — KETOROLAC TROMETHAMINE 15 MG/ML IJ SOLN
15.0000 mg | Freq: Once | INTRAMUSCULAR | Status: AC
  Administered 2022-12-06: 15 mg via INTRAMUSCULAR
  Filled 2022-12-06: qty 1

## 2022-12-06 MED ORDER — CEPHALEXIN 500 MG PO CAPS
500.0000 mg | ORAL_CAPSULE | Freq: Once | ORAL | Status: AC
  Administered 2022-12-06: 500 mg via ORAL
  Filled 2022-12-06: qty 1

## 2022-12-06 NOTE — ED Triage Notes (Signed)
Pt is has been seen at [facility] for hernia, scheduled for 12/3.  Pt states she was instructed to go to nearest ED if pain is worse. Rates 8/10 to mid abd.

## 2022-12-07 NOTE — ED Provider Notes (Signed)
[place] EMERGENCY DEPARTMENT AT [facility] Provider Note  CSN: [id number] Arrival date & time: 12/06/22 1717  Chief Complaint(s) Abdominal Pain  HPI Adriana Wiley is a 77 y.o. female with PMH umbilical hernia, T2DM, GERD, HLD who presents Emergency Department for evaluation of abdominal pain.  Patient endorsing right and left lower quadrant cramping abdominal pain that started today.  Her abdominal hernia surgery is scheduled for December but Adriana Wiley was instructed to go to the nearest emergency department if her abdominal pain worsened.  No associated nausea or vomiting.  Denies chest pain, shortness of breath, headache, fever or other systemic symptoms.   Past Medical History Past Medical History:  Diagnosis Date   Depression    Diabetes mellitus without complication (HCC)    GERD (gastroesophageal reflux disease)    Hyperlipemia    Neuropathy, peripheral    Sleep apnea    uses a cpap   Vertigo    chronic   Wears glasses    There are no problems to display for this patient.  Home Medication(s) Prior to Admission medications   Medication Sig Start Date End Date Taking? Authorizing Provider  cefadroxil (DURICEF) 500 MG capsule Take 1 capsule (500 mg total) by mouth 2 (two) times daily for 7 days. 12/06/22 12/13/22 Yes Carzell Saldivar, MD  aspirin 81 MG tablet Take 81 mg by mouth daily.    [provider]  fluticasone (FLONASE) 50 MCG/ACT nasal spray Place 2 sprays into the nose daily.    [provider]  fluticasone (FLOVENT HFA) 220 MCG/ACT inhaler Inhale 1 puff into the lungs 2 (two) times daily.    [provider]  gabapentin (NEURONTIN) 100 MG capsule Take 100 mg by mouth 3 (three) times daily.    [provider]  insulin aspart (NOVOLOG) 100 UNIT/ML injection Inject 10 Units into the skin 2 (two) times daily between meals.    [provider]  insulin glargine (LANTUS) 100 UNIT/ML injection Inject 50 Units into the skin  daily.    [provider]  meclizine (ANTIVERT) 25 MG tablet Take 25 mg by mouth 4 (four) times daily as needed.    [provider]  metFORMIN (GLUCOPHAGE) 500 MG tablet Take 500 mg by mouth daily with breakfast.    [provider]  omeprazole (PRILOSEC) 20 MG capsule Take 20 mg by mouth daily.    [provider]  oxyCODONE-acetaminophen (PERCOCET) 5-325 MG per tablet 1-2 tabs po q6 hours prn pain 06/26/12   Betha Loa, MD  predniSONE (DELTASONE) 10 MG tablet Take 2 tablets (20 mg total) by mouth daily. 09/25/13   Lorre Nick, MD  simvastatin (ZOCOR) 20 MG tablet Take 20 mg by mouth at bedtime.    [provider]  Past Surgical History Past Surgical History:  Procedure Laterality Date   ABDOMINAL HYSTERECTOMY     APPENDECTOMY     BREAST SURGERY     cyst lt br-neg   COLECTOMY  1992   cancer   DILATION AND CURETTAGE OF UTERUS     KNEE ARTHROSCOPY  1995   left   OPEN REDUCTION INTERNAL FIXATION (ORIF) DISTAL RADIAL FRACTURE Left 06/26/2012   Procedure: OPEN REDUCTION INTERNAL FIXATION (ORIF) LEFT DISTAL RADIAL FRACTURE;  Surgeon: Tami Ribas, MD;  Location: [facility];  Service: Orthopedics;  Laterality: Left;   Family History History reviewed. No pertinent family history.  Social History Social History   Tobacco Use   Smoking status: Never  Substance Use Topics   Alcohol use: No   Drug use: No   Allergies Metrizamide and Contrast media [iodinated contrast media]  Review of Systems Review of Systems  Gastrointestinal:  Positive for abdominal pain.    Physical Exam Vital Signs  I have reviewed the triage vital signs BP (!) 184/89 (BP Location: Right Arm)   Pulse (!) 57   Temp 98.1 F (36.7 C) (Oral)   Resp 17   Ht 5\' 2"  (1.575 m)   Wt 77.1 kg   SpO2 94%   BMI 31.09 kg/m    Physical Exam Vitals and nursing note reviewed.  Constitutional:      General: Adriana Wiley is not in acute distress.    Appearance: Adriana Wiley is well-developed.  HENT:     Head: Normocephalic and atraumatic.  Eyes:     Conjunctiva/sclera: Conjunctivae normal.  Cardiovascular:     Rate and Rhythm: Normal rate and regular rhythm.     Heart sounds: No murmur heard. Pulmonary:     Effort: Pulmonary effort is normal. No respiratory distress.     Breath sounds: Normal breath sounds.  Abdominal:     Palpations: Abdomen is soft.     Tenderness: There is abdominal tenderness in the right lower quadrant, suprapubic area and left lower quadrant.  Musculoskeletal:        General: No swelling.     Cervical back: Neck supple.  Skin:    General: Skin is warm and dry.     Capillary Refill: Capillary refill takes less than 2 seconds.  Neurological:     Mental Status: Adriana Wiley is alert.  Psychiatric:        Mood and Affect: Mood normal.     ED Results and Treatments Labs (all labs ordered are listed, but only abnormal results are displayed) Labs Reviewed  COMPREHENSIVE METABOLIC PANEL - Abnormal; Notable for the following components:      Result Value   Potassium 3.4 (*)    All other components within normal limits  URINALYSIS, ROUTINE W REFLEX MICROSCOPIC - Abnormal; Notable for the following components:   APPearance HAZY (*)    Leukocytes,Ua SMALL (*)    Bacteria, UA RARE (*)    All other components within normal limits  LIPASE, BLOOD  CBC  Radiology CT ABDOMEN PELVIS WO CONTRAST  Result Date: 12/06/2022 CLINICAL DATA:  Hernia suspected, inguinal or femoral concern for incarcerated hernia Patient reports pain around known hernia. EXAM: CT ABDOMEN AND PELVIS WITHOUT CONTRAST TECHNIQUE: Multidetector CT imaging of the abdomen and pelvis was performed following the standard  protocol without IV contrast. RADIATION DOSE REDUCTION: This exam was performed according to the departmental dose-optimization program which includes automated exposure control, adjustment of the mA and/or kV according to patient size and/or use of iterative reconstruction technique. COMPARISON:  None Available. FINDINGS: Lower chest: 4 mm subpleural right lower lobe nodule series 4, image 6. Hepatobiliary: Calcified granuloma in the left lobe of the liver. No evidence of focal liver abnormality on this unenhanced exam. Mild gallbladder distention without calcified gallstone. No biliary dilatation. Pancreas: No ductal dilatation or inflammation. Spleen: Normal in size without focal abnormality. Adrenals/Urinary Tract: No adrenal nodule. No hydronephrosis, renal calculi or perinephric inflammation. Decompressed ureters. Partially distended urinary bladder, normal for degree of distension. Stomach/Bowel: Broad-based umbilical hernia contains portions of the transverse colon. There is no obstruction, wall thickening or inflammation. There is a small upper abdominal ventral abdominal wall hernia which contains a small portion of the stomach. No definite gastric wall thickening. No bowel obstruction or inflammation. Colonic diverticulosis, most prominent in the sigmoid. No diverticulitis. The appendix is not definitively seen. Vascular/Lymphatic: Aortic atherosclerosis. No aortic aneurysm. No adenopathy. Reproductive: Status post hysterectomy. No adnexal masses. Other: Ventral abdominal wall hernias as described above. There is no inguinal hernia. No free air or ascites. Musculoskeletal: There are no acute or suspicious osseous abnormalities. IMPRESSION: 1. Broad-based umbilical hernia contains portions of the transverse colon. Small upper abdominal ventral abdominal wall hernia which contains a small portion of the stomach. There is no obstruction, wall thickening or inflammation. 2. Colonic diverticulosis without  diverticulitis. 3. A 4 mm subpleural right lower lobe pulmonary nodule. Per Fleischner Society Guidelines, no routine follow-up imaging is recommended. These guidelines do not apply to immunocompromised patients and patients with cancer. Follow up in patients with significant comorbidities as clinically warranted. For lung cancer screening, adhere to Lung-RADS guidelines. Reference: Radiology. 2017; 284(1):228-43. Electronically Signed   By: Narda Rutherford M.D.   On: 12/06/2022 20:08    Pertinent labs & imaging results that were available during my care of the patient were reviewed by me and considered in my medical decision making (see MDM for details).  Medications Ordered in ED Medications  ketorolac (TORADOL) 15 MG/ML injection 15 mg (15 mg Intramuscular Given 12/06/22 2145)  cephALEXin (KEFLEX) capsule 500 mg (500 mg Oral Given 12/06/22 2145)                                                                                                                                     Procedures Procedures  (including critical care time)  Medical Decision Making / ED Course   This patient presents to the ED  for concern of abdominal pain, this involves an extensive number of treatment options, and is a complaint that carries with it a high risk of complications and morbidity.  The differential diagnosis includes diverticulitis, epiploic appendagitis, colitis, gastroenteritis, constipation, nephrolithiasis, inflammatory bowel disease, cystitis, incarcerated hernia  MDM: Patient seen emerged part for evaluation of abdominal pain.  Physical exam with tenderness in the right and left lower quadrants, suprapubic area.  Laboratory evaluation with some mild hypokalemia at 3.4.  Urinalysis with small leuk esterase, 6-10 white blood cells and rare bacteria.  Given suprapubic abdominal tenderness, will cover with antibiotics.  CT abdomen pelvis redemonstrates umbilical hernia but no evidence of incarceration or  obstruction.  Pain controlled with Toradol and at this time Adriana Wiley does not meet inpatient criteria for admission and is safe for discharge with outpatient follow-up.  Patient discharged on Duricef.   Additional history obtained:  -External records from outside source obtained and reviewed including: Chart review including previous notes, labs, imaging, consultation notes   Lab Tests: -I ordered, reviewed, and interpreted labs.   The pertinent results include:   Labs Reviewed  COMPREHENSIVE METABOLIC PANEL - Abnormal; Notable for the following components:      Result Value   Potassium 3.4 (*)    All other components within normal limits  URINALYSIS, ROUTINE W REFLEX MICROSCOPIC - Abnormal; Notable for the following components:   APPearance HAZY (*)    Leukocytes,Ua SMALL (*)    Bacteria, UA RARE (*)    All other components within normal limits  LIPASE, BLOOD  CBC      Imaging Studies ordered: I ordered imaging studies including CTAP I independently visualized and interpreted imaging. I agree with the radiologist interpretation   Medicines ordered and prescription drug management: Meds ordered this encounter  Medications   ketorolac (TORADOL) 15 MG/ML injection 15 mg   cephALEXin (KEFLEX) capsule 500 mg   cefadroxil (DURICEF) 500 MG capsule    Sig: Take 1 capsule (500 mg total) by mouth 2 (two) times daily for 7 days.    Dispense:  14 capsule    Refill:  0    -I have reviewed the patients home medicines and have made adjustments as needed  Critical interventions none   Cardiac Monitoring: The patient was maintained on a cardiac monitor.  I personally viewed and interpreted the cardiac monitored which showed an underlying rhythm of: NSR  Social Determinants of Health:  Factors impacting patients care include: none   Reevaluation: After the interventions noted above, I reevaluated the patient and found that they have :improved  Co morbidities that complicate the  patient evaluation  Past Medical History:  Diagnosis Date   Depression    Diabetes mellitus without complication (HCC)    GERD (gastroesophageal reflux disease)    Hyperlipemia    Neuropathy, peripheral    Sleep apnea    uses a cpap   Vertigo    chronic   Wears glasses       Dispostion: I considered admission for this patient, but at this time Adriana Wiley does not meet inpatient criteria for admission Adriana Wiley is safe for discharge with outpatient follow-up     Final Clinical Impression(s) / ED Diagnoses Final diagnoses:  Lower abdominal pain  Acute cystitis with hematuria     @PCDICTATION @    Glendora Score, MD 12/07/22 0030

## 2022-12-14 DIAGNOSIS — F01B3 Vascular dementia, moderate, with mood disturbance: Secondary | ICD-10-CM | POA: Insufficient documentation

## 2023-01-04 ENCOUNTER — Encounter (HOSPITAL_COMMUNITY): Payer: Self-pay

## 2023-01-04 ENCOUNTER — Emergency Department (HOSPITAL_COMMUNITY): Payer: Medicare Other

## 2023-01-04 ENCOUNTER — Emergency Department (HOSPITAL_COMMUNITY)
Admission: EM | Admit: 2023-01-04 | Discharge: 2023-01-04 | Disposition: A | Discharge status: Home / Self Care | Payer: Medicare Other | Attending: Emergency Medicine | Admitting: Emergency Medicine

## 2023-01-04 DIAGNOSIS — Z794 Long term (current) use of insulin: Secondary | ICD-10-CM | POA: Insufficient documentation

## 2023-01-04 DIAGNOSIS — S0990XA Unspecified injury of head, initial encounter: Secondary | ICD-10-CM | POA: Diagnosis present

## 2023-01-04 DIAGNOSIS — W11XXXA Fall on and from ladder, initial encounter: Secondary | ICD-10-CM | POA: Insufficient documentation

## 2023-01-04 DIAGNOSIS — F0781 Postconcussional syndrome: Secondary | ICD-10-CM | POA: Diagnosis not present

## 2023-01-04 DIAGNOSIS — M542 Cervicalgia: Secondary | ICD-10-CM | POA: Insufficient documentation

## 2023-01-04 DIAGNOSIS — Z7982 Long term (current) use of aspirin: Secondary | ICD-10-CM | POA: Diagnosis not present

## 2023-01-04 DIAGNOSIS — W19XXXA Unspecified fall, initial encounter: Secondary | ICD-10-CM

## 2023-01-04 LAB — BASIC METABOLIC PANEL
Anion gap: 8 (ref 5–15)
BUN: 13 mg/dL (ref 8–23)
CO2: 29 mmol/L (ref 22–32)
Calcium: 9 mg/dL (ref 8.9–10.3)
Chloride: 100 mmol/L (ref 98–111)
Creatinine, Ser: 0.62 mg/dL (ref 0.44–1.00)
GFR, Estimated: >60 mL/min (ref >60)
Glucose, Bld: 82 mg/dL (ref 70–99)
Potassium: 4.2 mmol/L (ref 3.5–5.1)
Sodium: 137 mmol/L (ref 135–145)

## 2023-01-04 LAB — CBC WITH DIFFERENTIAL/PLATELET
Abs Immature Granulocytes: 0.03 10*3/uL (ref 0.00–0.07)
Basophils Absolute: 0 10*3/uL (ref 0.0–0.1)
Basophils Relative: 0 %
Eosinophils Absolute: 0.4 10*3/uL (ref 0.0–0.5)
Eosinophils Relative: 7 %
HCT: 39.9 % (ref 36.0–46.0)
Hemoglobin: 12.6 g/dL (ref 12.0–15.0)
Immature Granulocytes: 1 %
Lymphocytes Relative: 38 %
Lymphs Abs: 2.4 10*3/uL (ref 0.7–4.0)
MCH: 28.3 pg (ref 26.0–34.0)
MCHC: 31.6 g/dL (ref 30.0–36.0)
MCV: 89.7 fL (ref 80.0–100.0)
Monocytes Absolute: 0.6 10*3/uL (ref 0.1–1.0)
Monocytes Relative: 9 %
Neutro Abs: 2.9 10*3/uL (ref 1.7–7.7)
Neutrophils Relative %: 45 %
Platelets: 241 10*3/uL (ref 150–400)
RBC: 4.45 MIL/uL (ref 3.87–5.11)
RDW: 14.5 % (ref 11.5–15.5)
WBC: 6.3 10*3/uL (ref 4.0–10.5)
nRBC: 0 % (ref 0.0–0.2)

## 2023-01-04 MED ORDER — ACETAMINOPHEN 325 MG PO TABS
650.0000 mg | ORAL_TABLET | Freq: Once | ORAL | Status: AC
  Administered 2023-01-04: 650 mg via ORAL
  Filled 2023-01-04: qty 2

## 2023-01-04 NOTE — Discharge Instructions (Signed)
Tylenol every 4-6 hours as needed for headache.  Please follow-up with your primary care provider for recheck.  Return to the emergency department for any new or worsening symptoms.

## 2023-01-04 NOTE — ED Triage Notes (Signed)
Pt c/o headache r/t a fall 2.5 weeks ago.  Pain score 8/10.  Denies blood thinners.  Pt reports she was standing on a ladder when she lost her balance and fell.  Sts she hit her head on a table and the floor.

## 2023-01-04 NOTE — ED Provider Notes (Signed)
[place] EMERGENCY DEPARTMENT AT [facility] Provider Note   CSN: [id number] Arrival date & time: 79 01/04/23  1550     History  Chief Complaint  Patient presents with   Fall   Headache    HEILEY BLOEMER is a 77 y.o. female.   Fall Associated symptoms include headaches. Pertinent negatives include no chest pain, no abdominal pain and no shortness of breath.  Headache Associated symptoms: neck pain   Associated symptoms: no abdominal pain, no back pain, no diarrhea, no dizziness, no fever, no nausea, no neck stiffness, no seizures, no vomiting and no weakness        ADDISYN DEMETRIUS is a 77 y.o. female who presents to the Emergency Department complaining of persistent headache since mechanical fall 2-1/2 weeks ago.  States that she was standing on the first step of a stepladder when she slipped and fell backwards striking the back of her head on the corner of a table.  She believes there may have been a brief episode of LOC lasting a few minutes.  She was able to get up from the floor unassisted.  Since the fall, she describes a throbbing constant headache to the back of her head and neck that radiates to the top into her forehead area.  Pain will briefly subside with Tylenol.  She states that she has difficulty with her balance at baseline, but feels this is worse since the fall.  She denies any visual changes, nausea, vomiting, numbness or weakness.  Does not take blood thinners  Home Medications Prior to Admission medications   Medication Sig Start Date End Date Taking? Authorizing Provider  aspirin 81 MG tablet Take 81 mg by mouth daily.    [provider]  fluticasone (FLONASE) 50 MCG/ACT nasal spray Place 2 sprays into the nose daily.    [provider]  fluticasone (FLOVENT HFA) 220 MCG/ACT inhaler Inhale 1 puff into the lungs 2 (two) times daily.    [provider]  gabapentin (NEURONTIN) 100 MG capsule Take 100 mg by mouth 3 (three)  times daily.    [provider]  insulin aspart (NOVOLOG) 100 UNIT/ML injection Inject 10 Units into the skin 2 (two) times daily between meals.    [provider]  insulin glargine (LANTUS) 100 UNIT/ML injection Inject 50 Units into the skin daily.    [provider]  meclizine (ANTIVERT) 25 MG tablet Take 25 mg by mouth 4 (four) times daily as needed.    [provider]  metFORMIN (GLUCOPHAGE) 500 MG tablet Take 500 mg by mouth daily with breakfast.    [provider]  omeprazole (PRILOSEC) 20 MG capsule Take 20 mg by mouth daily.    [provider]  oxyCODONE-acetaminophen (PERCOCET) 5-325 MG per tablet 1-2 tabs po q6 hours prn pain 06/26/12   Betha Loa, MD  predniSONE (DELTASONE) 10 MG tablet Take 2 tablets (20 mg total) by mouth daily. 09/25/13   Lorre Nick, MD  simvastatin (ZOCOR) 20 MG tablet Take 20 mg by mouth at bedtime.    [provider]      Allergies    Metrizamide and Contrast media [iodinated contrast media]    Review of Systems   Review of Systems  Constitutional:  Negative for appetite change, chills and fever.  Eyes:  Negative for visual disturbance.  Respiratory:  Negative for shortness of breath.   Cardiovascular:  Negative for chest pain.  Gastrointestinal:  Negative for abdominal pain, diarrhea, nausea  and vomiting.  Musculoskeletal:  Positive for neck pain. Negative for arthralgias, back pain and neck stiffness.  Skin:  Negative for wound.  Neurological:  Positive for syncope and headaches. Negative for dizziness, seizures and weakness.    Physical Exam Updated Vital Signs BP (!) 187/68 (BP Location: Right Arm)   Pulse (!) 56   Temp (!) 97.5 F (36.4 C)   Resp 20   Ht 5\' 2"  (1.575 m)   Wt 77.1 kg   SpO2 95%   BMI 31.09 kg/m  Physical Exam Vitals and nursing note reviewed.  Constitutional:      General: She is not in acute distress.    Appearance: She is well-developed. She is not  ill-appearing or toxic-appearing.  HENT:     Head:     Comments: No lacerations or abrasions of the scalp.  Focal tenderness of the occipital and parietal scalp.  No palpable hematomas.    Right Ear: Tympanic membrane and ear canal normal.     Left Ear: Tympanic membrane and ear canal normal.  Eyes:     Extraocular Movements: Extraocular movements intact.     Conjunctiva/sclera: Conjunctivae normal.     Pupils: Pupils are equal, round, and reactive to light.  Cardiovascular:     Rate and Rhythm: Normal rate and regular rhythm.     Pulses: Normal pulses.  Pulmonary:     Effort: Pulmonary effort is normal.  Abdominal:     Palpations: Abdomen is soft.     Tenderness: There is no abdominal tenderness.  Musculoskeletal:        General: Normal range of motion.     Cervical back: Normal range of motion. Tenderness present. Muscular tenderness present. Normal range of motion.  Skin:    General: Skin is warm.     Capillary Refill: Capillary refill takes less than 2 seconds.  Neurological:     General: No focal deficit present.     Mental Status: She is alert.     Sensory: No sensory deficit.     Motor: No weakness.     ED Results / Procedures / Treatments   Labs (all labs ordered are listed, but only abnormal results are displayed) Labs Reviewed  CBC WITH DIFFERENTIAL/PLATELET  BASIC METABOLIC PANEL    EKG None  Radiology CT Cervical Spine Wo Contrast  Result Date: 01/04/2023 CLINICAL DATA:  Fall, neck trauma, headache EXAM: CT CERVICAL SPINE WITHOUT CONTRAST TECHNIQUE: Multidetector CT imaging of the cervical spine was performed without intravenous contrast. Multiplanar CT image reconstructions were also generated. RADIATION DOSE REDUCTION: This exam was performed according to the departmental dose-optimization program which includes automated exposure control, adjustment of the mA and/or kV according to patient size and/or use of iterative reconstruction technique. COMPARISON:   None Available. FINDINGS: Alignment: Normal Skull base and vertebrae: No acute fracture. No primary bone lesion or focal pathologic process. Soft tissues and spinal canal: No prevertebral fluid or swelling. No visible canal hematoma. Disc levels: Disc space narrowing and spurring in the lower cervical spine. No visible disc herniation. Upper chest: No acute findings Other: None IMPRESSION: No acute bony abnormality. Electronically Signed   By: Charlett Nose M.D.   On: 01/04/2023 21:27   CT Head Wo Contrast  Result Date: 01/04/2023 CLINICAL DATA:  Fall, hit back of head, headache EXAM: CT HEAD WITHOUT CONTRAST TECHNIQUE: Contiguous axial images were obtained from the base of the skull through the vertex without intravenous contrast. RADIATION DOSE REDUCTION: This exam was performed according  to the departmental dose-optimization program which includes automated exposure control, adjustment of the mA and/or kV according to patient size and/or use of iterative reconstruction technique. COMPARISON:  None Available. FINDINGS: Brain: There is atrophy and chronic small vessel disease changes. No acute intracranial abnormality. Specifically, no hemorrhage, hydrocephalus, mass lesion, acute infarction, or significant intracranial injury. Vascular: No hyperdense vessel or unexpected calcification. Skull: No acute calvarial abnormality. Sinuses/Orbits: Air-fluid level in the right sphenoid sinus. Remainder the paranasal sinuses clear. Other: None IMPRESSION: Atrophy, chronic microvascular disease. No acute intracranial abnormality. Air-fluid level in the right sphenoid sinus may reflect acute sinusitis. Electronically Signed   By: Charlett Nose M.D.   On: 01/04/2023 21:26     Procedures Procedures    Medications Ordered in ED Medications - No data to display  ED Course/ Medical Decision Making/ A&P                                 Medical Decision Making Patient here for evaluation of head injury that  occurred 2 and half weeks ago.  Endorses possible brief episode of LOC.  Complains of neck pain and persistent headache since her fall.  Has history of balance issues at baseline states they have been worse since her fall.  Does not take blood thinners.  Clinically, I suspect postconcussive syndrome, subdural, skull fracture, subarachnoid hemorrhage also considered  Amount and/or Complexity of Data Reviewed Labs: ordered.    Details: Labs unremarkable Radiology: ordered.    Details: CT head and C-spine without acute fracture or intracranial finding Discussion of management or test interpretation with external provider(s): I suspect patient headache is secondary to postconcussive syndrome.  I do not appreciate any focal neurologic deficit on her exam.  She is ambulatory in the department.  I feel she is appropriate for discharge home and agreeable to close outpatient follow-up for recheck.  Return precautions were given  Risk OTC drugs.           Final Clinical Impression(s) / ED Diagnoses Final diagnoses:  Fall, initial encounter  Post concussive syndrome    Rx / DC Orders ED Discharge Orders     None         Pauline Aus, PA-C 01/06/23 1327    Franne Forts, DO 01/06/23 1349

## 2023-01-23 DIAGNOSIS — I350 Nonrheumatic aortic (valve) stenosis: Secondary | ICD-10-CM | POA: Insufficient documentation

## 2023-05-28 ENCOUNTER — Other Ambulatory Visit: Payer: Self-pay

## 2023-05-28 ENCOUNTER — Emergency Department (HOSPITAL_COMMUNITY)
Admission: EM | Admit: 2023-05-28 | Discharge: 2023-05-28 | Disposition: A | Discharge status: Home / Self Care | Attending: Emergency Medicine | Admitting: Emergency Medicine

## 2023-05-28 DIAGNOSIS — Z7984 Long term (current) use of oral hypoglycemic drugs: Secondary | ICD-10-CM | POA: Diagnosis not present

## 2023-05-28 DIAGNOSIS — Z794 Long term (current) use of insulin: Secondary | ICD-10-CM | POA: Diagnosis not present

## 2023-05-28 DIAGNOSIS — J029 Acute pharyngitis, unspecified: Secondary | ICD-10-CM | POA: Insufficient documentation

## 2023-05-28 DIAGNOSIS — H9202 Otalgia, left ear: Secondary | ICD-10-CM | POA: Insufficient documentation

## 2023-05-28 DIAGNOSIS — E119 Type 2 diabetes mellitus without complications: Secondary | ICD-10-CM | POA: Insufficient documentation

## 2023-05-28 DIAGNOSIS — Z7982 Long term (current) use of aspirin: Secondary | ICD-10-CM | POA: Diagnosis not present

## 2023-05-28 LAB — RESP PANEL BY RT-PCR (RSV, FLU A&B, COVID)  RVPGX2
Influenza A by PCR: NEGATIVE
Influenza B by PCR: NEGATIVE
Resp Syncytial Virus by PCR: NEGATIVE
SARS Coronavirus 2 by RT PCR: NEGATIVE

## 2023-05-28 LAB — GROUP A STREP BY PCR: Group A Strep by PCR: NOT DETECTED

## 2023-05-28 MED ORDER — ACETAMINOPHEN 500 MG PO TABS
1000.0000 mg | ORAL_TABLET | Freq: Once | ORAL | Status: AC
  Administered 2023-05-28: 1000 mg via ORAL
  Filled 2023-05-28: qty 2

## 2023-05-28 MED ORDER — DEXAMETHASONE 4 MG PO TABS
10.0000 mg | ORAL_TABLET | Freq: Once | ORAL | Status: AC
  Administered 2023-05-28: 10 mg via ORAL
  Filled 2023-05-28: qty 3

## 2023-05-28 NOTE — Discharge Instructions (Signed)
 You are seen in the emergency department today for concerns of ear pain.  Your ears do not appear to have any evidence of infection at this time.  You are negative for COVID-19, influenza, RSV, and strep.  You do have some irritation of back of her throat/suspected likely have viral pharyngitis.  This is a infection of the back of her throat causing pain that radiates from the throat/neck into the ear.  You were given a dose of Decadron here for this pain.  We continue take Tylenol ibuprofen at home as needed for symptom control.  For any concerns or new or worsening symptoms return to the emergency department.

## 2023-05-28 NOTE — ED Provider Notes (Signed)
 [place] EMERGENCY DEPARTMENT AT [facility] Provider Note   CSN: [id number] Arrival date & time: 05/28/23  1609     History Chief Complaint  Patient presents with   Otalgia    Adriana Wiley is a 78 y.o. female.  Patient with past history significant for diabetes, vertigo, GERD, peripheral neuropathy presents to the emergency department with concerns of ear pain.  Endorsing pain to bilateral ears with left ear being more uncomfortable. Some sore throat. This been ongoing for the last week with some pain rating towards the throat/neck.  Denies any significant difficulty swallowing.  No recent fever chills or bodyaches.  No sick contacts as far she is aware.  States that she has had some worsening vertigo since her ears became uncomfortable.  Endorses a baseline history of tinnitus.   Otalgia      Home Medications Prior to Admission medications   Medication Sig Start Date End Date Taking? Authorizing Provider  aspirin 81 MG tablet Take 81 mg by mouth daily.    [provider]  fluticasone (FLONASE) 50 MCG/ACT nasal spray Place 2 sprays into the nose daily.    [provider]  fluticasone (FLOVENT HFA) 220 MCG/ACT inhaler Inhale 1 puff into the lungs 2 (two) times daily.    [provider]  gabapentin (NEURONTIN) 100 MG capsule Take 100 mg by mouth 3 (three) times daily.    [provider]  insulin aspart (NOVOLOG) 100 UNIT/ML injection Inject 10 Units into the skin 2 (two) times daily between meals.    [provider]  insulin glargine (LANTUS) 100 UNIT/ML injection Inject 50 Units into the skin daily.    [provider]  meclizine (ANTIVERT) 25 MG tablet Take 25 mg by mouth 4 (four) times daily as needed.    [provider]  metFORMIN (GLUCOPHAGE) 500 MG tablet Take 500 mg by mouth daily with breakfast.    [provider]  omeprazole (PRILOSEC) 20 MG capsule Take 20 mg by mouth daily.    [provider]  oxyCODONE-acetaminophen (PERCOCET) 5-325 MG per tablet 1-2 tabs po q6 hours prn pain 06/26/12   Betha Loa, MD  predniSONE (DELTASONE) 10 MG tablet Take 2 tablets (20 mg total) by mouth daily. 09/25/13   Lorre Nick, MD  simvastatin (ZOCOR) 20 MG tablet Take 20 mg by mouth at bedtime.    [provider]      Allergies    Metrizamide and Contrast media [iodinated contrast media]    Review of Systems   Review of Systems  HENT:  Positive for ear pain.   All other systems reviewed and are negative.   Physical Exam Updated Vital Signs BP 134/61   Pulse 60   Temp 98.8 F (37.1 C) (Oral)   Resp 16   Ht 5\' 2"  (1.575 m)   Wt 77.1 kg   SpO2 96%   BMI 31.09 kg/m  Physical Exam Vitals and nursing note reviewed.  Constitutional:      General: She is not in acute distress.    Appearance: She is well-developed.  HENT:     Head: Normocephalic and atraumatic.     Right Ear: Tympanic membrane and external ear normal. No middle ear effusion. Tympanic membrane is not injected, perforated, erythematous, retracted or bulging.     Left Ear: A middle ear effusion is present. Tympanic membrane is not injected, perforated, erythematous, retracted or bulging.  Eyes:     Conjunctiva/sclera: Conjunctivae normal.  Cardiovascular:  Rate and Rhythm: Normal rate and regular rhythm.     Heart sounds: No murmur heard. Pulmonary:     Effort: Pulmonary effort is normal. No respiratory distress.     Breath sounds: Normal breath sounds.  Abdominal:     Palpations: Abdomen is soft.     Tenderness: There is no abdominal tenderness.  Musculoskeletal:        General: No swelling.     Cervical back: Neck supple.  Skin:    General: Skin is warm and dry.     Capillary Refill: Capillary refill takes less than 2 seconds.  Neurological:     Mental Status: She is alert.  Psychiatric:        Mood and Affect: Mood normal.     ED Results / Procedures / Treatments   Labs (all  labs ordered are listed, but only abnormal results are displayed) Labs Reviewed  RESP PANEL BY RT-PCR (RSV, FLU A&B, COVID)  [id number]  GROUP A STREP BY PCR    EKG None  Radiology No results found.  Procedures Procedures    Medications Ordered in ED Medications  dexamethasone (DECADRON) tablet 10 mg (has no administration in time range)  acetaminophen (TYLENOL) tablet 1,000 mg (has no administration in time range)    ED Course/ Medical Decision Making/ A&P                                 Medical Decision Making  This patient presents to the ED for concern of ear pain.  Differential diagnosis includes otitis media, otitis externa, mastoiditis, pharyngitis   Lab Tests:  I Ordered, and personally interpreted labs.  The pertinent results include: Respiratory panel negative, group A strep negative   Medicines ordered and prescription drug management:  I ordered medication including Decadron, Tylenol for pharyngitis, pain Reevaluation of the patient after these medicines showed that the patient improved I have reviewed the patients home medicines and have made adjustments as needed   Problem List / ED Course:  Patient past history significant for diabetes, vertigo, GERD, peripheral neuropathy presents ED with concerns of ear pain.  Endorsing left ear pain with some radiation towards the neck.  This been ongoing for the last week.  Denies any difficulty swallowing but is endorsing pain in her throat.  Some headaches but no fever chills or bodyaches.  No cough or congestion. Physical exam reassuring.  No evidence of external or ear infection in left or right ears.  No mastoid tenderness.  Oropharynx is somewhat erythematous but no tonsillar exudate seen.  Will obtain respiratory swab as well as group A strep. Viral testing and group A strep were all negative.  Suspect this is likely viral pharyngitis.  Will administer a dose of Decadron and Tylenol for symptom  management. Informed patient of reassuring findings.  Patient agreeable with plans for medication administration here in the emergency department.  Advise close follow-up with PCP for further assessment.  Return precautions discussed.  ENT contacted patient provided for further evaluation if symptoms or not improving.  Patient otherwise stable and discharged home with plans for outpatient follow up.   Final Clinical Impression(s) / ED Diagnoses Final diagnoses:  Viral pharyngitis    Rx / DC Orders ED Discharge Orders     None         Salomon Mast 05/28/23 1940    Eber Hong, MD 05/30/23 1458

## 2023-05-28 NOTE — ED Notes (Signed)
 Notified pt that her daughter called and requested to call her before she leaves

## 2023-05-28 NOTE — ED Triage Notes (Signed)
 Pt reports left ear pain that hurts down into her neck x 1 week.

## 2023-05-28 NOTE — ED Notes (Signed)
 ED Provider at bedside.

## 2023-06-21 DIAGNOSIS — R519 Headache, unspecified: Secondary | ICD-10-CM | POA: Insufficient documentation

## 2023-07-10 DIAGNOSIS — I25119 Atherosclerotic heart disease of native coronary artery with unspecified angina pectoris: Secondary | ICD-10-CM | POA: Insufficient documentation

## 2023-07-10 DIAGNOSIS — Z952 Presence of prosthetic heart valve: Secondary | ICD-10-CM | POA: Insufficient documentation

## 2023-07-21 ENCOUNTER — Encounter (HOSPITAL_COMMUNITY)
Admission: RE | Admit: 2023-07-21 | Discharge: 2023-07-21 | Disposition: A | Discharge status: Home / Self Care | Source: Ambulatory Visit | Attending: Internal Medicine | Admitting: Internal Medicine

## 2023-07-21 DIAGNOSIS — Z952 Presence of prosthetic heart valve: Secondary | ICD-10-CM | POA: Insufficient documentation

## 2023-07-21 NOTE — Progress Notes (Signed)
 Completed virtual orientation today.  EP evaluation is scheduled for 08/01/23 at 1:00pm.  Documentation for diagnosis can be found in [facility] encounter 06/13/23.

## 2023-08-01 ENCOUNTER — Encounter (HOSPITAL_COMMUNITY)

## 2023-08-09 ENCOUNTER — Encounter (HOSPITAL_COMMUNITY)
Admission: RE | Admit: 2023-08-09 | Discharge: 2023-08-09 | Disposition: A | Discharge status: Home / Self Care | Source: Ambulatory Visit | Attending: Internal Medicine | Admitting: Internal Medicine

## 2023-08-09 VITALS — Ht 60.5 in | Wt 164.9 lb

## 2023-08-09 DIAGNOSIS — Z952 Presence of prosthetic heart valve: Secondary | ICD-10-CM

## 2023-08-09 NOTE — Progress Notes (Signed)
 Cardiac Individual Treatment Plan  Patient Details  Name: Adriana Wiley MRN: [id number] Date of Birth: 03/31/1945 Referring Provider:   Flowsheet Row CARDIAC REHAB PHASE II ORIENTATION from 08/09/2023 in [facility] CARDIAC REHABILITATION  Referring Provider Zebedee Hibbs MD       Initial Encounter Date:  Flowsheet Row CARDIAC REHAB PHASE II ORIENTATION from 08/09/2023 in [place] CARDIAC REHABILITATION  Date 08/09/23       Visit Diagnosis: S/P TAVR (transcatheter aortic valve replacement)  Patient's Home Medications on Admission:  Current Outpatient Medications:    albuterol (VENTOLIN HFA) 108 (90 Base) MCG/ACT inhaler, Inhale 1 puff into the lungs., Disp: , Rfl:    amiodarone (PACERONE) 200 MG tablet, Take by mouth., Disp: , Rfl:    anastrozole (ARIMIDEX) 1 MG tablet, Take 1 tablet by mouth daily., Disp: , Rfl:    aspirin 81 MG tablet, Take 81 mg by mouth daily., Disp: , Rfl:    BD INSULIN SYRINGE U/F 31G X 5/16" 1 ML MISC, Use one syringe daily with Lantus injection., Disp: , Rfl:    fluticasone (FLONASE) 50 MCG/ACT nasal spray, Place 2 sprays into the nose daily., Disp: , Rfl:    fluticasone (FLOVENT HFA) 220 MCG/ACT inhaler, Inhale 1 puff into the lungs 2 (two) times daily., Disp: , Rfl:    furosemide (LASIX) 20 MG tablet, Take 20 mg by mouth., Disp: , Rfl:    gabapentin (NEURONTIN) 100 MG capsule, Take 100 mg by mouth 3 (three) times daily., Disp: , Rfl:    glucose blood (ACCU-CHEK AVIVA PLUS) test strip, USE TO CHECK GLUCOSE LEVELS TWICE DAILY, Disp: , Rfl:    glucose blood (ONETOUCH ULTRA TEST) test strip, Test daily before breakfast. E11.9, Disp: , Rfl:    insulin aspart (NOVOLOG) 100 UNIT/ML injection, Inject 10 Units into the skin 2 (two) times daily between meals., Disp: , Rfl:    insulin glargine (LANTUS) 100 UNIT/ML injection, Inject 50 Units into the skin daily., Disp: , Rfl:    meclizine (ANTIVERT) 25 MG tablet, Take 25 mg by mouth 4 (four) times daily as  needed., Disp: , Rfl:    metFORMIN (GLUCOPHAGE) 500 MG tablet, Take 500 mg by mouth daily with breakfast., Disp: , Rfl:    omeprazole (PRILOSEC) 20 MG capsule, Take 20 mg by mouth daily., Disp: , Rfl:    oxyCODONE -acetaminophen  (PERCOCET) 5-325 MG per tablet, 1-2 tabs po q6 hours prn pain, Disp: 50 tablet, Rfl: 0   pantoprazole (PROTONIX) 40 MG tablet, Take 1 tablet by mouth daily., Disp: , Rfl:    predniSONE  (DELTASONE ) 10 MG tablet, Take 2 tablets (20 mg total) by mouth daily., Disp: 10 tablet, Rfl: 0   simvastatin (ZOCOR) 20 MG tablet, Take 20 mg by mouth at bedtime., Disp: , Rfl:   Past Medical History: Past Medical History:  Diagnosis Date   Depression    Diabetes mellitus without complication (HCC)    GERD (gastroesophageal reflux disease)    Hyperlipemia    Neuropathy, peripheral    Sleep apnea    uses a cpap   Vertigo    chronic   Wears glasses     Tobacco Use: Social History   Tobacco Use  Smoking Status Never  Smokeless Tobacco Not on file    Labs: Review Flowsheet       Latest Ref Rng & Units 11/22/2009  Labs for ITP Cardiac and Pulmonary Rehab  Cholestrol 0 - 200 mg/dL 409        ATP III  CLASSIFICATION:  <200     mg/dL   Desirable  829-562  mg/dL   Borderline High  >=130    mg/dL   High          LDL (calc) 0 - 99 mg/dL 865        Total Cholesterol/HDL:CHD Risk Coronary Heart Disease Risk Table                     Men   Women  1/2 Average Risk   3.4   3.3  Average Risk       5.0   4.4  2 X Average Risk   9.6   7.1  3 X Average Risk  23.4   11.0        Use the calculated Patient Ratio above and the CHD Risk Table to determine the patient's CHD Risk.        ATP III CLASSIFICATION (LDL):  <100     mg/dL   Optimal  784-696  mg/dL   Near or Above                    Optimal  130-159  mg/dL   Borderline  295-284  mg/dL   High  >132     mg/dL   Very High   HDL-C >44 mg/dL 37   Trlycerides <010 mg/dL 272   Hemoglobin Z3G <6.4 % 6.2 (NOTE)                                                                        According to the ADA Clinical Practice Recommendations for 2011, when HbA1c is used as a screening test:   >=6.5%   Diagnostic of Diabetes Mellitus           (if abnormal result  is confirmed)  5.7-6.4%   Increased risk of developing Diabetes Mellitus  References:Diagnosis and Classification of Diabetes Mellitus,Diabetes Care,2011,34(Suppl 1):S62-S69 and Standards of Medical Care in         Diabetes - 2011,Diabetes Care,2011,34  (Suppl 1):S11-S61.     Capillary Blood Glucose: Lab Results  Component Value Date   GLUCAP 299 (H) 09/12/2017   GLUCAP 345 (H) 09/11/2017   GLUCAP 393 (H) 09/11/2017   GLUCAP 92 06/26/2012   GLUCAP 112 (H) 06/26/2012     Exercise Target Goals: Exercise Program Goal: Individual exercise prescription set using results from initial 6 min walk test and THRR while considering  patient's activity barriers and safety.   Exercise Prescription Goal: Starting with aerobic activity 30 plus minutes a day, 3 days per week for initial exercise prescription. Provide home exercise prescription and guidelines that participant acknowledges understanding prior to discharge.  Activity Barriers & Risk Stratification:  Activity Barriers & Cardiac Risk Stratification - 07/21/23 1331       Activity Barriers & Cardiac Risk Stratification   Activity Barriers History of Falls;Assistive Device;Balance Concerns;Arthritis;Deconditioning;Shortness of Breath;Muscular Weakness    Cardiac Risk Stratification Moderate             6 Minute Walk:  6 Minute Walk     Row Name 08/09/23 1537         6 Minute Walk   Phase Initial  Distance 900 feet     Walk Time 6 minutes     # of Rest Breaks 0     MPH 1.7     METS 1.62     RPE 12     Perceived Dyspnea  1     VO2 Peak 5.68     Symptoms No     Resting HR 82 bpm     Resting BP 150/80     Resting Oxygen Saturation  95 %     Exercise Oxygen Saturation  during 6 min walk  96 %     Max Ex. HR 102 bpm     Max Ex. BP 160/76     2 Minute Post BP 140/78              Oxygen Initial Assessment:  Oxygen Initial Assessment - 07/21/23 1311       Home Oxygen   Home Oxygen Device Portable Concentrator    Sleep Oxygen Prescription CPAP    Home Exercise Oxygen Prescription None    Home Resting Oxygen Prescription Continuous    Compliance with Home Oxygen Use Yes      Intervention   Short Term Goals To learn and exhibit compliance with exercise, home and travel O2 prescription;To learn and understand importance of monitoring SPO2 with pulse oximeter and demonstrate accurate use of the pulse oximeter.;To learn and understand importance of maintaining oxygen saturations>88%;To learn and demonstrate proper use of respiratory medications;To learn and demonstrate proper pursed lip breathing techniques or other breathing techniques.     Long  Term Goals Exhibits compliance with exercise, home  and travel O2 prescription;Verbalizes importance of monitoring SPO2 with pulse oximeter and return demonstration;Maintenance of O2 saturations>88%;Exhibits proper breathing techniques, such as pursed lip breathing or other method taught during program session;Compliance with respiratory medication;Demonstrates proper use of MDI's             Oxygen Re-Evaluation:   Oxygen Discharge (Final Oxygen Re-Evaluation):   Initial Exercise Prescription:  Initial Exercise Prescription - 08/09/23 1500       Date of Initial Exercise RX and Referring Provider   Date 08/09/23    Referring Provider Zebedee Hibbs MD      NuStep   Level 1    SPM 50    Minutes 15    METs 1.9      Track   Laps 10    Minutes 15    METs 1.9      Prescription Details   Frequency (times per week) 3    Duration Progress to 30 minutes of continuous aerobic without signs/symptoms of physical distress      Intensity   THRR 40-80% of Max Heartrate 106-129    Ratings of Perceived Exertion 11-13     Perceived Dyspnea 0-4      Resistance Training   Training Prescription Yes    Weight 3    Reps 10-15             Perform Capillary Blood Glucose checks as needed.  Exercise Prescription Changes:   Exercise Prescription Changes     Row Name 08/09/23 1500             Response to Exercise   Blood Pressure (Admit) 150/80       Blood Pressure (Exercise) 160/76       Blood Pressure (Exit) 140/78       Heart Rate (Admit) 82 bpm       Heart Rate (Exercise) 102  bpm       Heart Rate (Exit) 86 bpm       Oxygen Saturation (Admit) 95 %       Oxygen Saturation (Exercise) 96 %       Oxygen Saturation (Exit) 96 %       Rating of Perceived Exertion (Exercise) 12       Perceived Dyspnea (Exercise) 1       Duration Progress to 30 minutes of  aerobic without signs/symptoms of physical distress                Exercise Comments:   Exercise Comments     Row Name 07/21/23 1313           Exercise Comments Currently doing exercise that the PT sent home with her.                Exercise Goals and Review:   Exercise Goals     Row Name 07/21/23 1317             Exercise Goals   Increase Physical Activity Yes       Intervention Provide advice, education, support and counseling about physical activity/exercise needs.;Develop an individualized exercise prescription for aerobic and resistive training based on initial evaluation findings, risk stratification, comorbidities and participant's personal goals.       Expected Outcomes Long Term: Exercising regularly at least 3-5 days a week.;Long Term: Add in home exercise to make exercise part of routine and to increase amount of physical activity.;Short Term: Attend rehab on a regular basis to increase amount of physical activity.       Increase Strength and Stamina Yes       Intervention Provide advice, education, support and counseling about physical activity/exercise needs.;Develop an individualized exercise prescription  for aerobic and resistive training based on initial evaluation findings, risk stratification, comorbidities and participant's personal goals.       Expected Outcomes Short Term: Increase workloads from initial exercise prescription for resistance, speed, and METs.;Long Term: Improve cardiorespiratory fitness, muscular endurance and strength as measured by increased METs and functional capacity ( );Short Term: Perform resistance training exercises routinely during rehab and add in resistance training at home       Able to understand and use rate of perceived exertion (RPE) scale Yes       Intervention Provide education and explanation on how to use RPE scale       Expected Outcomes Short Term: Able to use RPE daily in rehab to express subjective intensity level;Long Term:  Able to use RPE to guide intensity level when exercising independently       Able to understand and use Dyspnea scale Yes       Intervention Provide education and explanation on how to use Dyspnea scale       Expected Outcomes Short Term: Able to use Dyspnea scale daily in rehab to express subjective sense of shortness of breath during exertion;Long Term: Able to use Dyspnea scale to guide intensity level when exercising independently       Knowledge and understanding of Target Heart Rate Range (THRR) Yes       Intervention Provide education and explanation of THRR including how the numbers were predicted and where they are located for reference       Expected Outcomes Short Term: Able to state/look up THRR;Short Term: Able to use daily as guideline for intensity in rehab;Long Term: Able to use THRR to govern intensity when exercising independently  Able to check pulse independently Yes       Intervention Provide education and demonstration on how to check pulse in carotid and radial arteries.;Review the importance of being able to check your own pulse for safety during independent exercise       Expected Outcomes Long Term:  Able to check pulse independently and accurately;Short Term: Able to explain why pulse checking is important during independent exercise       Understanding of Exercise Prescription Yes       Intervention Provide education, explanation, and written materials on patient's individual exercise prescription       Expected Outcomes Long Term: Able to explain home exercise prescription to exercise independently;Short Term: Able to explain program exercise prescription                Exercise Goals Re-Evaluation :    Discharge Exercise Prescription (Final Exercise Prescription Changes):  Exercise Prescription Changes - 08/09/23 1500       Response to Exercise   Blood Pressure (Admit) 150/80    Blood Pressure (Exercise) 160/76    Blood Pressure (Exit) 140/78    Heart Rate (Admit) 82 bpm    Heart Rate (Exercise) 102 bpm    Heart Rate (Exit) 86 bpm    Oxygen Saturation (Admit) 95 %    Oxygen Saturation (Exercise) 96 %    Oxygen Saturation (Exit) 96 %    Rating of Perceived Exertion (Exercise) 12    Perceived Dyspnea (Exercise) 1    Duration Progress to 30 minutes of  aerobic without signs/symptoms of physical distress             Nutrition:  Target Goals: Understanding of nutrition guidelines, daily intake of sodium 1500mg , cholesterol 200mg , calories 30% from fat and 7% or less from saturated fats, daily to have 5 or more servings of fruits and vegetables.  Biometrics:  Pre Biometrics - 08/09/23 1543       Pre Biometrics   Height 5' 0.5" (1.537 m)    Weight 74.8 kg    Waist Circumference 30 inches    Hip Circumference 43 inches    Waist to Hip Ratio 0.7 %    BMI (Calculated) 31.66    Grip Strength 10.1 kg              Nutrition Therapy Plan and Nutrition Goals:  Nutrition Therapy & Goals - 07/21/23 1318       Intervention Plan   Intervention Prescribe, educate and counsel regarding individualized specific dietary modifications aiming towards targeted core  components such as weight, hypertension, lipid management, diabetes, heart failure and other comorbidities.;Nutrition handout(s) given to patient.    Expected Outcomes Short Term Goal: Understand basic principles of dietary content, such as calories, fat, sodium, cholesterol and nutrients.;Short Term Goal: A plan has been developed with personal nutrition goals set during dietitian appointment.;Long Term Goal: Adherence to prescribed nutrition plan.             Nutrition Assessments:  MEDIFICTS Score Key: >=70 Need to make dietary changes  40-70 Heart Healthy Diet <= 40 Therapeutic Level Cholesterol Diet   Picture Your Plate Scores: <16 Unhealthy dietary pattern with much room for improvement. 41-50 Dietary pattern unlikely to meet recommendations for good health and room for improvement. 51-60 More healthful dietary pattern, with some room for improvement.  >60 Healthy dietary pattern, although there may be some specific behaviors that could be improved.    Nutrition Goals Re-Evaluation:   Nutrition Goals Discharge (  Final Nutrition Goals Re-Evaluation):   Psychosocial: Target Goals: Acknowledge presence or absence of significant depression and/or stress, maximize coping skills, provide positive support system. Participant is able to verbalize types and ability to use techniques and skills needed for reducing stress and depression.  Initial Review & Psychosocial Screening:  Initial Psych Review & Screening - 07/21/23 1320       Family Dynamics   Comments Daughter is main support system, she lives alone.             Quality of Life Scores:  Scores of 19 and below usually indicate a poorer quality of life in these areas.  A difference of  2-3 points is a clinically meaningful difference.  A difference of 2-3 points in the total score of the Quality of Life Index has been associated with significant improvement in overall quality of life, self-image, physical symptoms,  and general health in studies assessing change in quality of life.  PHQ-9: Review Flowsheet       08/09/2023  Depression screen PHQ 2/9  Decreased Interest 0  Down, Depressed, Hopeless 0  PHQ - 2 Score 0  Altered sleeping 2  Tired, decreased energy 3  Change in appetite 0  Feeling bad or failure about yourself  0  Trouble concentrating 1  Moving slowly or fidgety/restless 0  Suicidal thoughts 0  PHQ-9 Score 6  Difficult doing work/chores Not difficult at all   Interpretation of Total Score  Total Score Depression Severity:  1-4 = Minimal depression, 5-9 = Mild depression, 10-14 = Moderate depression, 15-19 = Moderately severe depression, 20-27 = Severe depression   Psychosocial Evaluation and Intervention:  Psychosocial Evaluation - 07/21/23 1320       Psychosocial Evaluation & Interventions   Interventions Encouraged to exercise with the program and follow exercise prescription    Comments Ms. Mcmaster is a pleasant lady who is eager to start our program. 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 She was discharged on 06/21/23 to a SNF in [place] briefly for a week. PT encouraged walking with a rollating walker upon discharge. Upon talking with Rebekkah she states that she has had 1 fall in the last year and that was while she was hospitalized. She said it all happened so fast that she doesn't really remember what happened, but that she did get hurt from the fall. She says that she uses the walker as needed. She seems to be a little deconditioned with the history of a fall, arthritis, muscle weakness, etc. She is retired and for fun she likes to shop and decorate. She will be coming in for orientation with an EP on 08/01/23.    Expected Outcomes Short: Completely independent off the walker. Long: Increase strength and endurance.    Continue Psychosocial Services  Follow up required by staff             Psychosocial Re-Evaluation:   Psychosocial Discharge  (Final Psychosocial Re-Evaluation):   Vocational Rehabilitation: Provide vocational rehab assistance to qualifying candidates.   Vocational Rehab Evaluation & Intervention:  Vocational Rehab - 07/21/23 1308       Initial Vocational Rehab Evaluation & Intervention   Assessment shows need for Vocational Rehabilitation No             Education: Education Goals: Education classes will be provided on a weekly basis, covering required topics. Participant will state understanding/return demonstration of topics presented.  Learning Barriers/Preferences:  Learning Barriers/Preferences - 07/21/23 1307  Learning Barriers/Preferences   Learning Barriers Sight   Wears glasses   Learning Preferences Skilled Demonstration;Video;Group Instruction;Audio;Written Material             Education Topics: Hypertension, Hypertension Reduction -Define heart disease and high blood pressure. Discus how high blood pressure affects the body and ways to reduce high blood pressure.   Exercise and Your Heart -Discuss why it is important to exercise, the FITT principles of exercise, normal and abnormal responses to exercise, and how to exercise safely.   Angina -Discuss definition of angina, causes of angina, treatment of angina, and how to decrease risk of having angina.   Cardiac Medications -Review what the following cardiac medications are used for, how they affect the body, and side effects that may occur when taking the medications.  Medications include Aspirin, Beta blockers, calcium channel blockers, ACE Inhibitors, angiotensin receptor blockers, diuretics, digoxin, and antihyperlipidemics.   Congestive Heart Failure -Discuss the definition of CHF, how to live with CHF, the signs and symptoms of CHF, and how keep track of weight and sodium intake.   Heart Disease and Intimacy -Discus the effect sexual activity has on the heart, how changes occur during intimacy as we age, and  safety during sexual activity.   Smoking Cessation / COPD -Discuss different methods to quit smoking, the health benefits of quitting smoking, and the definition of COPD.   Nutrition I: Fats -Discuss the types of cholesterol, what cholesterol does to the heart, and how cholesterol levels can be controlled.   Nutrition II: Labels -Discuss the different components of food labels and how to read food label   Heart Parts/Heart Disease and PAD -Discuss the anatomy of the heart, the pathway of blood circulation through the heart, and these are affected by heart disease.   Stress I: Signs and Symptoms -Discuss the causes of stress, how stress may lead to anxiety and depression, and ways to limit stress.   Stress II: Relaxation -Discuss different types of relaxation techniques to limit stress.   Warning Signs of Stroke / TIA -Discuss definition of a stroke, what the signs and symptoms are of a stroke, and how to identify when someone is having stroke.   Knowledge Questionnaire Score:   Core Components/Risk Factors/Patient Goals at Admission:  Personal Goals and Risk Factors at Admission - 07/21/23 1308       Core Components/Risk Factors/Patient Goals on Admission   Improve shortness of breath with ADL's Yes    Intervention Provide education, individualized exercise plan and daily activity instruction to help decrease symptoms of SOB with activities of daily living.    Expected Outcomes Short Term: Improve cardiorespiratory fitness to achieve a reduction of symptoms when performing ADLs;Long Term: Be able to perform more ADLs without symptoms or delay the onset of symptoms    Increase knowledge of respiratory medications and ability to use respiratory devices properly  Yes    Intervention Provide education and demonstration as needed of appropriate use of medications, inhalers, and oxygen therapy.    Expected Outcomes Long Term: Maintain appropriate use of medications, inhalers,  and oxygen therapy.;Short Term: Achieves understanding of medications use. Understands that oxygen is a medication prescribed by physician. Demonstrates appropriate use of inhaler and oxygen therapy.    Diabetes Yes    Intervention Provide education about signs/symptoms and action to take for hypo/hyperglycemia.;Provide education about proper nutrition, including hydration, and aerobic/resistive exercise prescription along with prescribed medications to achieve blood glucose in normal ranges: Fasting glucose 65-99 mg/dL  Expected Outcomes Short Term: Participant verbalizes understanding of the signs/symptoms and immediate care of hyper/hypoglycemia, proper foot care and importance of medication, aerobic/resistive exercise and nutrition plan for blood glucose control.;Long Term: Attainment of HbA1C < 7%.    Hypertension Yes    Intervention Provide education on lifestyle modifcations including regular physical activity/exercise, weight management, moderate sodium restriction and increased consumption of fresh fruit, vegetables, and low fat dairy, alcohol moderation, and smoking cessation.;Monitor prescription use compliance.    Expected Outcomes Long Term: Maintenance of blood pressure at goal levels.;Short Term: Continued assessment and intervention until BP is < 140/73mm HG in hypertensive participants. < 130/58mm HG in hypertensive participants with diabetes, heart failure or chronic kidney disease.    Stress Yes    Intervention Offer individual and/or small group education and counseling on adjustment to heart disease, stress management and health-related lifestyle change. Teach and support self-help strategies.;Refer participants experiencing significant psychosocial distress to appropriate mental health specialists for further evaluation and treatment. When possible, include family members and significant others in education/counseling sessions.    Expected Outcomes Short Term: Participant  demonstrates changes in health-related behavior, relaxation and other stress management skills, ability to obtain effective social support, and compliance with psychotropic medications if prescribed.;Long Term: Emotional wellbeing is indicated by absence of clinically significant psychosocial distress or social isolation.             Core Components/Risk Factors/Patient Goals Review:    Core Components/Risk Factors/Patient Goals at Discharge (Final Review):    ITP Comments:  ITP Comments     Row Name 07/21/23 1320           ITP Comments Completed virtual orientation today.  EP evaluation is scheduled for 08/01/23 at 1:00pm.  Documentation for diagnosis can be found in [facility] encounter 06/13/23.                Comments: Patient arrived for 1st visit/orientation/education at 69. Patient was referred to CR by Dr. Zebedee Hibbs due to S/P TAVR. During orientation advised patient on arrival and appointment times what to wear, what to do before, during and after exercise. Reviewed attendance and class policy.  Pt is scheduled to return Cardiac Rehab on 08/16/23 at 1100. Pt was advised to come to class 15 minutes before class starts.  Discussed RPE/Dpysnea scales. Patient participated in warm up stretches. Patient was able to complete 6 minute walk test.  Telemetry:NSR. Patient was measured for the equipment. Discussed equipment safety with patient. Took patient pre-anthropometric measurements. Patient finished visit at 1515.

## 2023-08-09 NOTE — Patient Instructions (Signed)
 Patient Instructions  Patient Details  Name: Adriana Wiley MRN: [id number] Date of Birth: [birth year] Referring Provider:  Tiller, Rosanne, MD  Below are your personal goals for exercise, nutrition, and risk factors. Our goal is to help you stay on track towards obtaining and maintaining these goals. We will be discussing your progress on these goals with you throughout the program.  Initial Exercise Prescription:  Initial Exercise Prescription - 08/09/23 1500       Date of Initial Exercise RX and Referring Provider   Date 08/09/23    Referring Provider Zebedee Hibbs MD      NuStep   Level 1    SPM 50    Minutes 15    METs 1.9      Track   Laps 10    Minutes 15    METs 1.9      Prescription Details   Frequency (times per week) 3    Duration Progress to 30 minutes of continuous aerobic without signs/symptoms of physical distress      Intensity   THRR 40-80% of Max Heartrate 106-129    Ratings of Perceived Exertion 11-13    Perceived Dyspnea 0-4      Resistance Training   Training Prescription Yes    Weight 3    Reps 10-15             Exercise Goals: Frequency: Be able to perform aerobic exercise two to three times per week in program working toward 2-5 days per week of home exercise.  Intensity: Work with a perceived exertion of 11 (fairly light) - 15 (hard) while following your exercise prescription.  We will make changes to your prescription with you as you progress through the program.   Duration: Be able to do 30 to 45 minutes of continuous aerobic exercise in addition to a 5 minute warm-up and a 5 minute cool-down routine.   Nutrition Goals: Your personal nutrition goals will be established when you do your nutrition analysis with the dietician.  The following are general nutrition guidelines to follow: Cholesterol < 200mg /day Sodium < 1500mg /day Fiber: Women over 50 yrs - 21 grams per day  Personal Goals:  Personal Goals and Risk Factors at  Admission - 07/21/23 1308       Core Components/Risk Factors/Patient Goals on Admission   Improve shortness of breath with ADL's Yes    Intervention Provide education, individualized exercise plan and daily activity instruction to help decrease symptoms of SOB with activities of daily living.    Expected Outcomes Short Term: Improve cardiorespiratory fitness to achieve a reduction of symptoms when performing ADLs;Long Term: Be able to perform more ADLs without symptoms or delay the onset of symptoms    Increase knowledge of respiratory medications and ability to use respiratory devices properly  Yes    Intervention Provide education and demonstration as needed of appropriate use of medications, inhalers, and oxygen therapy.    Expected Outcomes Long Term: Maintain appropriate use of medications, inhalers, and oxygen therapy.;Short Term: Achieves understanding of medications use. Understands that oxygen is a medication prescribed by physician. Demonstrates appropriate use of inhaler and oxygen therapy.    Diabetes Yes    Intervention Provide education about signs/symptoms and action to take for hypo/hyperglycemia.;Provide education about proper nutrition, including hydration, and aerobic/resistive exercise prescription along with prescribed medications to achieve blood glucose in normal ranges: Fasting glucose 65-99 mg/dL    Expected Outcomes Short Term: Participant verbalizes understanding of the signs/symptoms and immediate care  of hyper/hypoglycemia, proper foot care and importance of medication, aerobic/resistive exercise and nutrition plan for blood glucose control.;Long Term: Attainment of HbA1C < 7%.    Hypertension Yes    Intervention Provide education on lifestyle modifcations including regular physical activity/exercise, weight management, moderate sodium restriction and increased consumption of fresh fruit, vegetables, and low fat dairy, alcohol moderation, and smoking cessation.;Monitor  prescription use compliance.    Expected Outcomes Long Term: Maintenance of blood pressure at goal levels.;Short Term: Continued assessment and intervention until BP is < 140/9mm HG in hypertensive participants. < 130/13mm HG in hypertensive participants with diabetes, heart failure or chronic kidney disease.    Stress Yes    Intervention Offer individual and/or small group education and counseling on adjustment to heart disease, stress management and health-related lifestyle change. Teach and support self-help strategies.;Refer participants experiencing significant psychosocial distress to appropriate mental health specialists for further evaluation and treatment. When possible, include family members and significant others in education/counseling sessions.    Expected Outcomes Short Term: Participant demonstrates changes in health-related behavior, relaxation and other stress management skills, ability to obtain effective social support, and compliance with psychotropic medications if prescribed.;Long Term: Emotional wellbeing is indicated by absence of clinically significant psychosocial distress or social isolation.             Tobacco Use Initial Evaluation: Social History   Tobacco Use  Smoking Status Never  Smokeless Tobacco Not on file    Exercise Goals and Review:  Exercise Goals     Row Name 07/21/23 1317             Exercise Goals   Increase Physical Activity Yes       Intervention Provide advice, education, support and counseling about physical activity/exercise needs.;Develop an individualized exercise prescription for aerobic and resistive training based on initial evaluation findings, risk stratification, comorbidities and participant's personal goals.       Expected Outcomes Long Term: Exercising regularly at least 3-5 days a week.;Long Term: Add in home exercise to make exercise part of routine and to increase amount of physical activity.;Short Term: Attend rehab on  a regular basis to increase amount of physical activity.       Increase Strength and Stamina Yes       Intervention Provide advice, education, support and counseling about physical activity/exercise needs.;Develop an individualized exercise prescription for aerobic and resistive training based on initial evaluation findings, risk stratification, comorbidities and participant's personal goals.       Expected Outcomes Short Term: Increase workloads from initial exercise prescription for resistance, speed, and METs.;Long Term: Improve cardiorespiratory fitness, muscular endurance and strength as measured by increased METs and functional capacity ( );Short Term: Perform resistance training exercises routinely during rehab and add in resistance training at home       Able to understand and use rate of perceived exertion (RPE) scale Yes       Intervention Provide education and explanation on how to use RPE scale       Expected Outcomes Short Term: Able to use RPE daily in rehab to express subjective intensity level;Long Term:  Able to use RPE to guide intensity level when exercising independently       Able to understand and use Dyspnea scale Yes       Intervention Provide education and explanation on how to use Dyspnea scale       Expected Outcomes Short Term: Able to use Dyspnea scale daily in rehab to express subjective sense of shortness  of breath during exertion;Long Term: Able to use Dyspnea scale to guide intensity level when exercising independently       Knowledge and understanding of Target Heart Rate Range (THRR) Yes       Intervention Provide education and explanation of THRR including how the numbers were predicted and where they are located for reference       Expected Outcomes Short Term: Able to state/look up THRR;Short Term: Able to use daily as guideline for intensity in rehab;Long Term: Able to use THRR to govern intensity when exercising independently       Able to check pulse  independently Yes       Intervention Provide education and demonstration on how to check pulse in carotid and radial arteries.;Review the importance of being able to check your own pulse for safety during independent exercise       Expected Outcomes Long Term: Able to check pulse independently and accurately;Short Term: Able to explain why pulse checking is important during independent exercise       Understanding of Exercise Prescription Yes       Intervention Provide education, explanation, and written materials on patient's individual exercise prescription       Expected Outcomes Long Term: Able to explain home exercise prescription to exercise independently;Short Term: Able to explain program exercise prescription                Copy of goals given to participant.

## 2023-08-16 ENCOUNTER — Encounter (HOSPITAL_COMMUNITY)
Admission: RE | Admit: 2023-08-16 | Discharge: 2023-08-16 | Discharge status: Home / Self Care | Source: Ambulatory Visit | Attending: Internal Medicine

## 2023-08-16 DIAGNOSIS — Z952 Presence of prosthetic heart valve: Secondary | ICD-10-CM

## 2023-08-16 LAB — GLUCOSE, CAPILLARY
Glucose-Capillary: 112 mg/dL — ABNORMAL HIGH (ref 70–99)
Glucose-Capillary: 118 mg/dL — ABNORMAL HIGH (ref 70–99)

## 2023-08-16 NOTE — Progress Notes (Signed)
 Daily Session Note  Patient Details  Name: Adriana Wiley MRN: [id number] Date of Birth: Mar 05, 1946 Referring Provider:   Flowsheet Row CARDIAC REHAB PHASE II ORIENTATION from 08/09/2023 in [facility]  Referring Provider Zebedee Hibbs MD       Encounter Date: 08/16/2023  Check In:  Session Check In - 08/16/23 1030       Check-In   Supervising physician immediately available to respond to emergencies See telemetry face sheet for immediately available MD    Location [facility]    Staff Present Clotilda Danish, BS, Exercise Physiologist;Jessica Zoila Hines, MA, RCEP, CCRP, CCET    Virtual Visit No    Medication changes reported     No    Fall or balance concerns reported    No    Tobacco Cessation No Change    Warm-up and Cool-down Performed on first and last piece of equipment    Resistance Training Performed Yes    VAD Patient? No    PAD/SET Patient? No      Pain Assessment   Currently in Pain? No/denies    Multiple Pain Sites No             Capillary Blood Glucose: No results found for this or any previous visit (from the past 24 hours).    Social History   Tobacco Use  Smoking Status Never  Smokeless Tobacco Not on file    Goals Met:  Independence with exercise equipment Exercise tolerated well No report of concerns or symptoms today Strength training completed today  Goals Unmet:  Not Applicable  Comments: First full day of exercise!  Patient was oriented to gym and equipment including functions, settings, policies, and procedures.  Patient's individual exercise prescription and treatment plan were reviewed.  All starting workloads were established based on the results of the 6 minute walk test done at initial orientation visit.  The plan for exercise progression was also introduced and progression will be customized based on patient's performance and goals.

## 2023-08-18 ENCOUNTER — Encounter (HOSPITAL_COMMUNITY)

## 2023-08-18 ENCOUNTER — Encounter (HOSPITAL_COMMUNITY)
Admission: RE | Admit: 2023-08-18 | Discharge: 2023-08-18 | Disposition: A | Discharge status: Home / Self Care | Source: Ambulatory Visit | Attending: Internal Medicine | Admitting: Internal Medicine

## 2023-08-18 DIAGNOSIS — Z952 Presence of prosthetic heart valve: Secondary | ICD-10-CM

## 2023-08-18 LAB — GLUCOSE, CAPILLARY
Glucose-Capillary: 114 mg/dL — ABNORMAL HIGH (ref 70–99)
Glucose-Capillary: 105 mg/dL — ABNORMAL HIGH (ref 70–99)

## 2023-08-18 NOTE — Progress Notes (Signed)
 Daily Session Note  Patient Details  Name: Adriana Wiley MRN: [id number] Date of Birth: [birth year] Referring Provider:   Flowsheet Row CARDIAC REHAB PHASE II ORIENTATION from 08/09/2023 in [facility] CARDIAC REHABIL[facility]ATION  Referring Provider Zebedee Hibbs MD       Encounter Date: 08/18/2023  Check In:  Session Check In - 08/18/23 1130       Check-In   Supervising physician immediately available to respond to emergencies See telemetry face sheet for immediately available MD    Location [facility]    Staff Present Rita Cherry, MA, RCEP, CCRP, Amador Bad, RN, BSN    Virtual Visit No    Medication changes reported     No    Fall or balance concerns reported    No    Warm-up and Cool-down Performed on first and last piece of equipment    Resistance Training Performed Yes    VAD Patient? No    PAD/SET Patient? No      Pain Assessment   Currently in Pain? No/denies             Capillary Blood Glucose: No results found for this or any previous visit (from the past 24 hours).    Social History   Tobacco Use  Smoking Status Never  Smokeless Tobacco Not on file    Goals Met:  Exercise tolerated well No report of concerns or symptoms today Strength training completed today  Goals Unmet:  Not Applicable  Comments: Pt able to follow exercise prescription today without complaint.  Will continue to monitor for progression.

## 2023-08-21 ENCOUNTER — Encounter (HOSPITAL_COMMUNITY)

## 2023-08-23 ENCOUNTER — Encounter (HOSPITAL_COMMUNITY)

## 2023-08-25 ENCOUNTER — Encounter (HOSPITAL_COMMUNITY)

## 2023-08-28 ENCOUNTER — Encounter (HOSPITAL_COMMUNITY)

## 2023-08-30 ENCOUNTER — Telehealth (HOSPITAL_COMMUNITY): Payer: Self-pay

## 2023-08-30 ENCOUNTER — Encounter (HOSPITAL_COMMUNITY): Admission: RE | Admit: 2023-08-30 | Source: Ambulatory Visit

## 2023-09-01 ENCOUNTER — Encounter (HOSPITAL_COMMUNITY): Admission: RE | Admit: 2023-09-01 | Source: Ambulatory Visit

## 2023-09-04 ENCOUNTER — Encounter (HOSPITAL_COMMUNITY)
Admission: RE | Admit: 2023-09-04 | Discharge: 2023-09-04 | Disposition: A | Discharge status: Home / Self Care | Source: Ambulatory Visit | Attending: Internal Medicine | Admitting: Internal Medicine

## 2023-09-04 DIAGNOSIS — Z952 Presence of prosthetic heart valve: Secondary | ICD-10-CM | POA: Insufficient documentation

## 2023-09-06 ENCOUNTER — Encounter (HOSPITAL_COMMUNITY)

## 2023-09-06 NOTE — Progress Notes (Signed)
 Cardiac Individual Treatment Plan  Patient Details  Name: Adriana Wiley MRN: [id number] Date of Birth: 29-Nov-1945 Referring Provider:   Flowsheet Row CARDIAC REHAB PHASE II ORIENTATION from 08/09/2023 in [facility] CARDIAC REHABILITATION  Referring Provider Zebedee Hibbs MD    Initial Encounter Date:  Flowsheet Row CARDIAC REHAB PHASE II ORIENTATION from 08/09/2023 in [place] CARDIAC REHABILITATION  Date 08/09/23    Visit Diagnosis: No diagnosis found.  Patient's Home Medications on Admission:  Current Outpatient Medications:    albuterol (VENTOLIN HFA) 108 (90 Base) MCG/ACT inhaler, Inhale 1 puff into the lungs., Disp: , Rfl:    amiodarone (PACERONE) 200 MG tablet, Take by mouth., Disp: , Rfl:    anastrozole (ARIMIDEX) 1 MG tablet, Take 1 tablet by mouth daily., Disp: , Rfl:    aspirin 81 MG tablet, Take 81 mg by mouth daily., Disp: , Rfl:    BD INSULIN SYRINGE U/F 31G X 5/16 1 ML MISC, Use one syringe daily with Lantus injection., Disp: , Rfl:    fluticasone (FLONASE) 50 MCG/ACT nasal spray, Place 2 sprays into the nose daily., Disp: , Rfl:    fluticasone (FLOVENT HFA) 220 MCG/ACT inhaler, Inhale 1 puff into the lungs 2 (two) times daily., Disp: , Rfl:    furosemide (LASIX) 20 MG tablet, Take 20 mg by mouth., Disp: , Rfl:    gabapentin (NEURONTIN) 100 MG capsule, Take 100 mg by mouth 3 (three) times daily., Disp: , Rfl:    glucose blood (ACCU-CHEK AVIVA PLUS) test strip, USE TO CHECK GLUCOSE LEVELS TWICE DAILY, Disp: , Rfl:    glucose blood (ONETOUCH ULTRA TEST) test strip, Test daily before breakfast. E11.9, Disp: , Rfl:    insulin aspart (NOVOLOG) 100 UNIT/ML injection, Inject 10 Units into the skin 2 (two) times daily between meals., Disp: , Rfl:    insulin glargine (LANTUS) 100 UNIT/ML injection, Inject 50 Units into the skin daily., Disp: , Rfl:    meclizine (ANTIVERT) 25 MG tablet, Take 25 mg by mouth 4 (four) times daily as needed., Disp: , Rfl:    metFORMIN  (GLUCOPHAGE) 500 MG tablet, Take 500 mg by mouth daily with breakfast., Disp: , Rfl:    omeprazole (PRILOSEC) 20 MG capsule, Take 20 mg by mouth daily., Disp: , Rfl:    oxyCODONE -acetaminophen  (PERCOCET) 5-325 MG per tablet, 1-2 tabs po q6 hours prn pain, Disp: 50 tablet, Rfl: 0   pantoprazole (PROTONIX) 40 MG tablet, Take 1 tablet by mouth daily., Disp: , Rfl:    predniSONE  (DELTASONE ) 10 MG tablet, Take 2 tablets (20 mg total) by mouth daily., Disp: 10 tablet, Rfl: 0   simvastatin (ZOCOR) 20 MG tablet, Take 20 mg by mouth at bedtime., Disp: , Rfl:   Past Medical History: Past Medical History:  Diagnosis Date   Depression    Diabetes mellitus without complication (HCC)    GERD (gastroesophageal reflux disease)    Hyperlipemia    Neuropathy, peripheral    Sleep apnea    uses a cpap   Vertigo    chronic   Wears glasses     Tobacco Use: Social History   Tobacco Use  Smoking Status Never  Smokeless Tobacco Not on file    Labs: Review Flowsheet       Latest Ref Rng & Units 11/22/2009  Labs for ITP Cardiac and Pulmonary Rehab  Cholestrol 0 - 200 mg/dL 409        ATP III CLASSIFICATION:  <200     mg/dL  Desirable  200-239  mg/dL   Borderline High  >=536    mg/dL   High          LDL (calc) 0 - 99 mg/dL 644        Total Cholesterol/HDL:CHD Risk Coronary Heart Disease Risk Table                     Men   Women  1/2 Average Risk   3.4   3.3  Average Risk       5.0   4.4  2 X Average Risk   9.6   7.1  3 X Average Risk  23.4   11.0        Use the calculated Patient Ratio above and the CHD Risk Table to determine the patient's CHD Risk.        ATP III CLASSIFICATION (LDL):  <100     mg/dL   Optimal  034-742  mg/dL   Near or Above                    Optimal  130-159  mg/dL   Borderline  595-638  mg/dL   High  >756     mg/dL   Very High   HDL-C >43 mg/dL 37   Trlycerides <329 mg/dL 518   Hemoglobin A4Z <6.6 % 6.2 (NOTE)                                                                        According to the ADA Clinical Practice Recommendations for 2011, when HbA1c is used as a screening test:   >=6.5%   Diagnostic of Diabetes Mellitus           (if abnormal result  is confirmed)  5.7-6.4%   Increased risk of developing Diabetes Mellitus  References:Diagnosis and Classification of Diabetes Mellitus,Diabetes Care,2011,34(Suppl 1):S62-S69 and Standards of Medical Care in         Diabetes - 2011,Diabetes Care,2011,34  (Suppl 1):S11-S61.     Capillary Blood Glucose: Lab Results  Component Value Date   GLUCAP 105 (H) 08/18/2023   GLUCAP 114 (H) 08/18/2023   GLUCAP 118 (H) 08/16/2023   GLUCAP 112 (H) 08/16/2023   GLUCAP 299 (H) 09/12/2017     Exercise Target Goals: Exercise Program Goal: Individual exercise prescription set using results from initial 6 min walk test and THRR while considering  patient's activity barriers and safety.   Exercise Prescription Goal: Starting with aerobic activity 30 plus minutes a day, 3 days per week for initial exercise prescription. Provide home exercise prescription and guidelines that participant acknowledges understanding prior to discharge.  Activity Barriers & Risk Stratification:  Activity Barriers & Cardiac Risk Stratification - 07/21/23 1331       Activity Barriers & Cardiac Risk Stratification   Activity Barriers History of Falls;Assistive Device;Balance Concerns;Arthritis;Deconditioning;Shortness of Breath;Muscular Weakness    Cardiac Risk Stratification Moderate          6 Minute Walk:  6 Minute Walk     Row Name 08/09/23 1537         6 Minute Walk   Phase Initial     Distance 900 feet     Walk Time 6 minutes     #  of Rest Breaks 0     MPH 1.7     METS 1.62     RPE 12     Perceived Dyspnea  1     VO2 Peak 5.68     Symptoms No     Resting HR 82 bpm     Resting BP 150/80     Resting Oxygen Saturation  95 %     Exercise Oxygen Saturation  during 6 min walk 96 %     Max Ex. HR 102 bpm      Max Ex. BP 160/76     2 Minute Post BP 140/78        Oxygen Initial Assessment:  Oxygen Initial Assessment - 07/21/23 1311       Home Oxygen   Home Oxygen Device Portable Concentrator    Sleep Oxygen Prescription CPAP    Home Exercise Oxygen Prescription None    Home Resting Oxygen Prescription Continuous    Compliance with Home Oxygen Use Yes      Intervention   Short Term Goals To learn and exhibit compliance with exercise, home and travel O2 prescription;To learn and understand importance of monitoring SPO2 with pulse oximeter and demonstrate accurate use of the pulse oximeter.;To learn and understand importance of maintaining oxygen saturations>88%;To learn and demonstrate proper use of respiratory medications;To learn and demonstrate proper pursed lip breathing techniques or other breathing techniques.     Long  Term Goals Exhibits compliance with exercise, home  and travel O2 prescription;Verbalizes importance of monitoring SPO2 with pulse oximeter and return demonstration;Maintenance of O2 saturations>88%;Exhibits proper breathing techniques, such as pursed lip breathing or other method taught during program session;Compliance with respiratory medication;Demonstrates proper use of MDI's          Oxygen Re-Evaluation:   Oxygen Discharge (Final Oxygen Re-Evaluation):   Initial Exercise Prescription:  Initial Exercise Prescription - 08/09/23 1500       Date of Initial Exercise RX and Referring Provider   Date 08/09/23    Referring Provider Zebedee Hibbs MD      NuStep   Level 1    SPM 50    Minutes 15    METs 1.9      Track   Laps 10    Minutes 15    METs 1.9      Prescription Details   Frequency (times per week) 3    Duration Progress to 30 minutes of continuous aerobic without signs/symptoms of physical distress      Intensity   THRR 40-80% of Max Heartrate 106-129    Ratings of Perceived Exertion 11-13    Perceived Dyspnea 0-4      Resistance  Training   Training Prescription Yes    Weight 3    Reps 10-15          Perform Capillary Blood Glucose checks as needed.  Exercise Prescription Changes:   Exercise Prescription Changes     Row Name 08/09/23 1500 08/17/23 1000           Response to Exercise   Blood Pressure (Admit) 150/80 124/64      Blood Pressure (Exercise) 160/76 148/80      Blood Pressure (Exit) 140/78 124/80      Heart Rate (Admit) 82 bpm 68 bpm      Heart Rate (Exercise) 102 bpm 1113 bpm      Heart Rate (Exit) 86 bpm 77 bpm      Oxygen Saturation (Admit) 95 % --  Oxygen Saturation (Exercise) 96 % --      Oxygen Saturation (Exit) 96 % --      Rating of Perceived Exertion (Exercise) 12 12      Perceived Dyspnea (Exercise) 1 --      Duration Progress to 30 minutes of  aerobic without signs/symptoms of physical distress Continue with 30 min of aerobic exercise without signs/symptoms of physical distress.      Intensity -- THRR unchanged        Progression   Progression -- Continue to progress workloads to maintain intensity without signs/symptoms of physical distress.        Resistance Training   Training Prescription -- Yes      Weight -- 3      Reps -- 10-15        NuStep   Level -- 1      SPM -- 73      Minutes -- 15      METs -- 1.7        Track   Laps -- 14      Minutes -- 15      METs -- 1.6         Exercise Comments:   Exercise Comments     Row Name 07/21/23 1313 08/16/23 1044         Exercise Comments Currently doing exercise that the PT sent home with her. First full day of exercise!  Patient was oriented to gym and equipment including functions, settings, policies, and procedures.  Patient's individual exercise prescription and treatment plan were reviewed.  All starting workloads were established based on the results of the 6 minute walk test done at initial orientation visit.  The plan for exercise progression was also introduced and progression will be customized  based on patient's performance and goals.         Exercise Goals and Review:   Exercise Goals     Row Name 07/21/23 1317             Exercise Goals   Increase Physical Activity Yes       Intervention Provide advice, education, support and counseling about physical activity/exercise needs.;Develop an individualized exercise prescription for aerobic and resistive training based on initial evaluation findings, risk stratification, comorbidities and participant's personal goals.       Expected Outcomes Long Term: Exercising regularly at least 3-5 days a week.;Long Term: Add in home exercise to make exercise part of routine and to increase amount of physical activity.;Short Term: Attend rehab on a regular basis to increase amount of physical activity.       Increase Strength and Stamina Yes       Intervention Provide advice, education, support and counseling about physical activity/exercise needs.;Develop an individualized exercise prescription for aerobic and resistive training based on initial evaluation findings, risk stratification, comorbidities and participant's personal goals.       Expected Outcomes Short Term: Increase workloads from initial exercise prescription for resistance, speed, and METs.;Long Term: Improve cardiorespiratory fitness, muscular endurance and strength as measured by increased METs and functional capacity ( );Short Term: Perform resistance training exercises routinely during rehab and add in resistance training at home       Able to understand and use rate of perceived exertion (RPE) scale Yes       Intervention Provide education and explanation on how to use RPE scale       Expected Outcomes Short Term: Able to use RPE daily in rehab to  express subjective intensity level;Long Term:  Able to use RPE to guide intensity level when exercising independently       Able to understand and use Dyspnea scale Yes       Intervention Provide education and explanation on how to  use Dyspnea scale       Expected Outcomes Short Term: Able to use Dyspnea scale daily in rehab to express subjective sense of shortness of breath during exertion;Long Term: Able to use Dyspnea scale to guide intensity level when exercising independently       Knowledge and understanding of Target Heart Rate Range (THRR) Yes       Intervention Provide education and explanation of THRR including how the numbers were predicted and where they are located for reference       Expected Outcomes Short Term: Able to state/look up THRR;Short Term: Able to use daily as guideline for intensity in rehab;Long Term: Able to use THRR to govern intensity when exercising independently       Able to check pulse independently Yes       Intervention Provide education and demonstration on how to check pulse in carotid and radial arteries.;Review the importance of being able to check your own pulse for safety during independent exercise       Expected Outcomes Long Term: Able to check pulse independently and accurately;Short Term: Able to explain why pulse checking is important during independent exercise       Understanding of Exercise Prescription Yes       Intervention Provide education, explanation, and written materials on patient's individual exercise prescription       Expected Outcomes Long Term: Able to explain home exercise prescription to exercise independently;Short Term: Able to explain program exercise prescription          Exercise Goals Re-Evaluation :  Exercise Goals Re-Evaluation     Row Name 08/16/23 1044             Exercise Goal Re-Evaluation   Exercise Goals Review Able to understand and use Dyspnea scale;Able to understand and use rate of perceived exertion (RPE) scale;Knowledge and understanding of Target Heart Rate Range (THRR)       Comments Reviewed RPE and dyspnea scale, THR and program prescription with pt today.  Pt voiced understanding and was given a copy of goals to take home.        Expected Outcomes Short: Use RPE daily to regulate intensity.  Long: Follow program prescription in THR.           Discharge Exercise Prescription (Final Exercise Prescription Changes):  Exercise Prescription Changes - 08/17/23 1000       Response to Exercise   Blood Pressure (Admit) 124/64    Blood Pressure (Exercise) 148/80    Blood Pressure (Exit) 124/80    Heart Rate (Admit) 68 bpm    Heart Rate (Exercise) 1113 bpm    Heart Rate (Exit) 77 bpm    Rating of Perceived Exertion (Exercise) 12    Duration Continue with 30 min of aerobic exercise without signs/symptoms of physical distress.    Intensity THRR unchanged      Progression   Progression Continue to progress workloads to maintain intensity without signs/symptoms of physical distress.      Resistance Training   Training Prescription Yes    Weight 3    Reps 10-15      NuStep   Level 1    SPM 73    Minutes 15  METs 1.7      Track   Laps 14    Minutes 15    METs 1.6          Nutrition:  Target Goals: Understanding of nutrition guidelines, daily intake of sodium 1500mg , cholesterol 200mg , calories 30% from fat and 7% or less from saturated fats, daily to have 5 or more servings of fruits and vegetables.  Biometrics:  Pre Biometrics - 08/09/23 1543       Pre Biometrics   Height 5' 0.5 (1.537 m)    Weight 74.8 kg    Waist Circumference 30 inches    Hip Circumference 43 inches    Waist to Hip Ratio 0.7 %    BMI (Calculated) 31.66    Grip Strength 10.1 kg           Nutrition Therapy Plan and Nutrition Goals:  Nutrition Therapy & Goals - 07/21/23 1318       Intervention Plan   Intervention Prescribe, educate and counsel regarding individualized specific dietary modifications aiming towards targeted core components such as weight, hypertension, lipid management, diabetes, heart failure and other comorbidities.;Nutrition handout(s) given to patient.    Expected Outcomes Short Term Goal:  Understand basic principles of dietary content, such as calories, fat, sodium, cholesterol and nutrients.;Short Term Goal: A plan has been developed with personal nutrition goals set during dietitian appointment.;Long Term Goal: Adherence to prescribed nutrition plan.          Nutrition Assessments:  MEDIFICTS Score Key: >=70 Need to make dietary changes  40-70 Heart Healthy Diet <= 40 Therapeutic Level Cholesterol Diet   Picture Your Plate Scores: <27 Unhealthy dietary pattern with much room for improvement. 41-50 Dietary pattern unlikely to meet recommendations for good health and room for improvement. 51-60 More healthful dietary pattern, with some room for improvement.  >60 Healthy dietary pattern, although there may be some specific behaviors that could be improved.    Nutrition Goals Re-Evaluation:   Nutrition Goals Discharge (Final Nutrition Goals Re-Evaluation):   Psychosocial: Target Goals: Acknowledge presence or absence of significant depression and/or stress, maximize coping skills, provide positive support system. Participant is able to verbalize types and ability to use techniques and skills needed for reducing stress and depression.  Initial Review & Psychosocial Screening:  Initial Psych Review & Screening - 07/21/23 1320       Family Dynamics   Comments Daughter is main support system, she lives alone.          Quality of Life Scores:  Scores of 19 and below usually indicate a poorer quality of life in these areas.  A difference of  2-3 points is a clinically meaningful difference.  A difference of 2-3 points in the total score of the Quality of Life Index has been associated with significant improvement in overall quality of life, self-image, physical symptoms, and general health in studies assessing change in quality of life.  PHQ-9: Review Flowsheet       08/09/2023  Depression screen PHQ 2/9  Decreased Interest 0  Down, Depressed, Hopeless 0   PHQ - 2 Score 0  Altered sleeping 2  Tired, decreased energy 3  Change in appetite 0  Feeling bad or failure about yourself  0  Trouble concentrating 1  Moving slowly or fidgety/restless 0  Suicidal thoughts 0  PHQ-9 Score 6  Difficult doing work/chores Not difficult at all   Interpretation of Total Score  Total Score Depression Severity:  1-4 = Minimal depression, 5-9 =  Mild depression, 10-14 = Moderate depression, 15-19 = Moderately severe depression, 20-27 = Severe depression   Psychosocial Evaluation and Intervention:  Psychosocial Evaluation - 07/21/23 1320       Psychosocial Evaluation & Interventions   Interventions Encouraged to exercise with the program and follow exercise prescription    Comments Ms. Gadea is a pleasant lady who is eager to start our program. 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 She was discharged on 06/21/23 to a SNF in [place] briefly for a week. PT encouraged walking with a rollating walker upon discharge. Upon talking with Axelle she states that she has had 1 fall in the last year and that was while she was hospitalized. She said it all happened so fast that she doesn't really remember what happened, but that she did get hurt from the fall. She says that she uses the walker as needed. She seems to be a little deconditioned with the history of a fall, arthritis, muscle weakness, etc. She is retired and for fun she likes to shop and decorate. She will be coming in for orientation with an EP on 08/01/23.    Expected Outcomes Short: Completely independent off the walker. Long: Increase strength and endurance.    Continue Psychosocial Services  Follow up required by staff          Psychosocial Re-Evaluation:   Psychosocial Discharge (Final Psychosocial Re-Evaluation):   Vocational Rehabilitation: Provide vocational rehab assistance to qualifying candidates.   Vocational Rehab Evaluation & Intervention:  Vocational Rehab -  07/21/23 1308       Initial Vocational Rehab Evaluation & Intervention   Assessment shows need for Vocational Rehabilitation No          Education: Education Goals: Education classes will be provided on a weekly basis, covering required topics. Participant will state understanding/return demonstration of topics presented.  Learning Barriers/Preferences:  Learning Barriers/Preferences - 07/21/23 1307       Learning Barriers/Preferences   Learning Barriers Sight   Wears glasses   Learning Preferences Skilled Demonstration;Video;Group Instruction;Audio;Written Material          Education Topics: Hypertension, Hypertension Reduction -Define heart disease and high blood pressure. Discus how high blood pressure affects the body and ways to reduce high blood pressure.   Exercise and Your Heart -Discuss why it is important to exercise, the FITT principles of exercise, normal and abnormal responses to exercise, and how to exercise safely.   Angina -Discuss definition of angina, causes of angina, treatment of angina, and how to decrease risk of having angina.   Cardiac Medications -Review what the following cardiac medications are used for, how they affect the body, and side effects that may occur when taking the medications.  Medications include Aspirin, Beta blockers, calcium channel blockers, ACE Inhibitors, angiotensin receptor blockers, diuretics, digoxin, and antihyperlipidemics. Flowsheet Row CARDIAC REHAB PHASE II EXERCISE from 08/16/2023 in [place] CARDIAC REHABILITATION  Date 08/16/23  Educator jh  Instruction Review Code 1- Verbalizes Understanding    Congestive Heart Failure -Discuss the definition of CHF, how to live with CHF, the signs and symptoms of CHF, and how keep track of weight and sodium intake.   Heart Disease and Intimacy -Discus the effect sexual activity has on the heart, how changes occur during intimacy as we age, and safety during sexual  activity.   Smoking Cessation / COPD -Discuss different methods to quit smoking, the health benefits of quitting smoking, and the definition of COPD.  Nutrition I: Fats -Discuss the types of cholesterol, what cholesterol does to the heart, and how cholesterol levels can be controlled.   Nutrition II: Labels -Discuss the different components of food labels and how to read food label   Heart Parts/Heart Disease and PAD -Discuss the anatomy of the heart, the pathway of blood circulation through the heart, and these are affected by heart disease.   Stress I: Signs and Symptoms -Discuss the causes of stress, how stress may lead to anxiety and depression, and ways to limit stress.   Stress II: Relaxation -Discuss different types of relaxation techniques to limit stress.   Warning Signs of Stroke / TIA -Discuss definition of a stroke, what the signs and symptoms are of a stroke, and how to identify when someone is having stroke.   Knowledge Questionnaire Score:   Core Components/Risk Factors/Patient Goals at Admission:  Personal Goals and Risk Factors at Admission - 07/21/23 1308       Core Components/Risk Factors/Patient Goals on Admission   Improve shortness of breath with ADL's Yes    Intervention Provide education, individualized exercise plan and daily activity instruction to help decrease symptoms of SOB with activities of daily living.    Expected Outcomes Short Term: Improve cardiorespiratory fitness to achieve a reduction of symptoms when performing ADLs;Long Term: Be able to perform more ADLs without symptoms or delay the onset of symptoms    Increase knowledge of respiratory medications and ability to use respiratory devices properly  Yes    Intervention Provide education and demonstration as needed of appropriate use of medications, inhalers, and oxygen therapy.    Expected Outcomes Long Term: Maintain appropriate use of medications, inhalers, and oxygen  therapy.;Short Term: Achieves understanding of medications use. Understands that oxygen is a medication prescribed by physician. Demonstrates appropriate use of inhaler and oxygen therapy.    Diabetes Yes    Intervention Provide education about signs/symptoms and action to take for hypo/hyperglycemia.;Provide education about proper nutrition, including hydration, and aerobic/resistive exercise prescription along with prescribed medications to achieve blood glucose in normal ranges: Fasting glucose 65-99 mg/dL    Expected Outcomes Short Term: Participant verbalizes understanding of the signs/symptoms and immediate care of hyper/hypoglycemia, proper foot care and importance of medication, aerobic/resistive exercise and nutrition plan for blood glucose control.;Long Term: Attainment of HbA1C < 7%.    Hypertension Yes    Intervention Provide education on lifestyle modifcations including regular physical activity/exercise, weight management, moderate sodium restriction and increased consumption of fresh fruit, vegetables, and low fat dairy, alcohol moderation, and smoking cessation.;Monitor prescription use compliance.    Expected Outcomes Long Term: Maintenance of blood pressure at goal levels.;Short Term: Continued assessment and intervention until BP is < 140/51mm HG in hypertensive participants. < 130/77mm HG in hypertensive participants with diabetes, heart failure or chronic kidney disease.    Stress Yes    Intervention Offer individual and/or small group education and counseling on adjustment to heart disease, stress management and health-related lifestyle change. Teach and support self-help strategies.;Refer participants experiencing significant psychosocial distress to appropriate mental health specialists for further evaluation and treatment. When possible, include family members and significant others in education/counseling sessions.    Expected Outcomes Short Term: Participant demonstrates changes  in health-related behavior, relaxation and other stress management skills, ability to obtain effective social support, and compliance with psychotropic medications if prescribed.;Long Term: Emotional wellbeing is indicated by absence of clinically significant psychosocial distress or social isolation.  Core Components/Risk Factors/Patient Goals Review:    Core Components/Risk Factors/Patient Goals at Discharge (Final Review):    ITP Comments:  ITP Comments     Row Name 07/21/23 1320 08/16/23 1043 09/06/23 0903       ITP Comments Completed virtual orientation today.  EP evaluation is scheduled for 08/01/23 at 1:00pm.  Documentation for diagnosis can be found in [facility] encounter 06/13/23. First full day of exercise!  Patient was oriented to gym and equipment including functions, settings, policies, and procedures.  Patient's individual exercise prescription and treatment plan were reviewed.  All starting workloads were established based on the results of the 6 minute walk test done at initial orientation visit.  The plan for exercise progression was also introduced and progression will be customized based on patient's performance and goals. 30 day review completed. ITP sent to Dr. Armida Lander, Medical Director of Cardiac Rehab. Continue with ITP unless changes are made by physician. Patient is new to the program and has only attended 2 sessions after her orientation. She has been contacted and says she has MD appointments but attendance remains inconsistent.        Comments: 30 Day Review

## 2023-09-08 ENCOUNTER — Encounter (HOSPITAL_COMMUNITY)

## 2023-09-11 ENCOUNTER — Encounter (HOSPITAL_COMMUNITY)

## 2023-09-13 ENCOUNTER — Encounter (HOSPITAL_COMMUNITY)

## 2023-09-15 ENCOUNTER — Encounter (HOSPITAL_COMMUNITY)

## 2023-09-15 NOTE — Progress Notes (Signed)
 Cardiac Individual Treatment Plan  Patient Details  Name: Adriana Wiley MRN: [id number] Date of Birth: November 15, 1945 Referring Provider:   Flowsheet Row CARDIAC REHAB PHASE II ORIENTATION from 08/09/2023 in [facility] CARDIAC REHABILITATION  Referring Provider Barnet Cough MD    Initial Encounter Date:  Flowsheet Row CARDIAC REHAB PHASE II ORIENTATION from 08/09/2023 in [facility]  Date 08/09/23    Visit Diagnosis: S/P TAVR (transcatheter aortic valve replacement)  Patient's Home Medications on Admission:  Current Outpatient Medications:    albuterol (VENTOLIN HFA) 108 (90 Base) MCG/ACT inhaler, Inhale 1 puff into the lungs., Disp: , Rfl:    amiodarone (PACERONE) 200 MG tablet, Take by mouth., Disp: , Rfl:    anastrozole (ARIMIDEX) 1 MG tablet, Take 1 tablet by mouth daily., Disp: , Rfl:    aspirin 81 MG tablet, Take 81 mg by mouth daily., Disp: , Rfl:    BD INSULIN SYRINGE U/F 31G X 5/16 1 ML MISC, Use one syringe daily with Lantus injection., Disp: , Rfl:    fluticasone (FLONASE) 50 MCG/ACT nasal spray, Place 2 sprays into the nose daily., Disp: , Rfl:    fluticasone (FLOVENT HFA) 220 MCG/ACT inhaler, Inhale 1 puff into the lungs 2 (two) times daily., Disp: , Rfl:    furosemide (LASIX) 20 MG tablet, Take 20 mg by mouth., Disp: , Rfl:    gabapentin (NEURONTIN) 100 MG capsule, Take 100 mg by mouth 3 (three) times daily., Disp: , Rfl:    glucose blood (ACCU-CHEK AVIVA PLUS) test strip, USE TO CHECK GLUCOSE LEVELS TWICE DAILY, Disp: , Rfl:    glucose blood (ONETOUCH ULTRA TEST) test strip, Test daily before breakfast. E11.9, Disp: , Rfl:    insulin aspart (NOVOLOG) 100 UNIT/ML injection, Inject 10 Units into the skin 2 (two) times daily between meals., Disp: , Rfl:    insulin glargine (LANTUS) 100 UNIT/ML injection, Inject 50 Units into the skin daily., Disp: , Rfl:    meclizine (ANTIVERT) 25 MG tablet, Take 25 mg by mouth 4 (four) times daily as needed.,  Disp: , Rfl:    metFORMIN (GLUCOPHAGE) 500 MG tablet, Take 500 mg by mouth daily with breakfast., Disp: , Rfl:    omeprazole (PRILOSEC) 20 MG capsule, Take 20 mg by mouth daily., Disp: , Rfl:    oxyCODONE -acetaminophen  (PERCOCET) 5-325 MG per tablet, 1-2 tabs po q6 hours prn pain, Disp: 50 tablet, Rfl: 0   pantoprazole (PROTONIX) 40 MG tablet, Take 1 tablet by mouth daily., Disp: , Rfl:    predniSONE  (DELTASONE ) 10 MG tablet, Take 2 tablets (20 mg total) by mouth daily., Disp: 10 tablet, Rfl: 0   simvastatin (ZOCOR) 20 MG tablet, Take 20 mg by mouth at bedtime., Disp: , Rfl:   Past Medical History: Past Medical History:  Diagnosis Date   Depression    Diabetes mellitus without complication (HCC)    GERD (gastroesophageal reflux disease)    Hyperlipemia    Neuropathy, peripheral    Sleep apnea    uses a cpap   Vertigo    chronic   Wears glasses     Tobacco Use: Social History   Tobacco Use  Smoking Status Never  Smokeless Tobacco Not on file    Labs: Review Flowsheet       Latest Ref Rng & Units 11/22/2009  Labs for ITP Cardiac and Pulmonary Rehab  Cholestrol 0 - 200 mg/dL 816        ATP III CLASSIFICATION:  <200  mg/dL   Desirable  799-760  mg/dL   Borderline High  >=759    mg/dL   High          LDL (calc) 0 - 99 mg/dL 877        Total Cholesterol/HDL:CHD Risk Coronary Heart Disease Risk Table                     Men   Women  1/2 Average Risk   3.4   3.3  Average Risk       5.0   4.4  2 X Average Risk   9.6   7.1  3 X Average Risk  23.4   11.0        Use the calculated Patient Ratio above and the CHD Risk Table to determine the patient's CHD Risk.        ATP III CLASSIFICATION (LDL):  <100     mg/dL   Optimal  899-870  mg/dL   Near or Above                    Optimal  130-159  mg/dL   Borderline  839-810  mg/dL   High  >809     mg/dL   Very High   HDL-C >60 mg/dL 37   Trlycerides <849 mg/dL 877   Hemoglobin J8r <4.2 % 6.2 (NOTE)                                                                        According to the ADA Clinical Practice Recommendations for 2011, when HbA1c is used as a screening test:   >=6.5%   Diagnostic of Diabetes Mellitus           (if abnormal result  is confirmed)  5.7-6.4%   Increased risk of developing Diabetes Mellitus  References:Diagnosis and Classification of Diabetes Mellitus,Diabetes Care,2011,34(Suppl 1):S62-S69 and Standards of Medical Care in         Diabetes - 2011,Diabetes Care,2011,34  (Suppl 1):S11-S61.     Capillary Blood Glucose: Lab Results  Component Value Date   GLUCAP 105 (H) 08/18/2023   GLUCAP 114 (H) 08/18/2023   GLUCAP 118 (H) 08/16/2023   GLUCAP 112 (H) 08/16/2023   GLUCAP 299 (H) 09/12/2017     Exercise Target Goals: Exercise Program Goal: Individual exercise prescription set using results from initial 6 min walk test and THRR while considering  patient's activity barriers and safety.   Exercise Prescription Goal: Starting with aerobic activity 30 plus minutes a day, 3 days per week for initial exercise prescription. Provide home exercise prescription and guidelines that participant acknowledges understanding prior to discharge.  Activity Barriers & Risk Stratification:  Activity Barriers & Cardiac Risk Stratification - 07/21/23 1331       Activity Barriers & Cardiac Risk Stratification   Activity Barriers History of Falls;Assistive Device;Balance Concerns;Arthritis;Deconditioning;Shortness of Breath;Muscular Weakness    Cardiac Risk Stratification Moderate          6 Minute Walk:  6 Minute Walk     Row Name 08/09/23 1537         6 Minute Walk   Phase Initial     Distance 900 feet     Walk  Time 6 minutes     # of Rest Breaks 0     MPH 1.7     METS 1.62     RPE 12     Perceived Dyspnea  1     VO2 Peak 5.68     Symptoms No     Resting HR 82 bpm     Resting BP 150/80     Resting Oxygen Saturation  95 %     Exercise Oxygen Saturation  during 6 min walk 96 %      Max Ex. HR 102 bpm     Max Ex. BP 160/76     2 Minute Post BP 140/78        Oxygen Initial Assessment:  Oxygen Initial Assessment - 07/21/23 1311       Home Oxygen   Home Oxygen Device Portable Concentrator    Sleep Oxygen Prescription CPAP    Home Exercise Oxygen Prescription None    Home Resting Oxygen Prescription Continuous    Compliance with Home Oxygen Use Yes      Intervention   Short Term Goals To learn and exhibit compliance with exercise, home and travel O2 prescription;To learn and understand importance of monitoring SPO2 with pulse oximeter and demonstrate accurate use of the pulse oximeter.;To learn and understand importance of maintaining oxygen saturations>88%;To learn and demonstrate proper use of respiratory medications;To learn and demonstrate proper pursed lip breathing techniques or other breathing techniques.     Long  Term Goals Exhibits compliance with exercise, home  and travel O2 prescription;Verbalizes importance of monitoring SPO2 with pulse oximeter and return demonstration;Maintenance of O2 saturations>88%;Exhibits proper breathing techniques, such as pursed lip breathing or other method taught during program session;Compliance with respiratory medication;Demonstrates proper use of MDI's          Oxygen Re-Evaluation:   Oxygen Discharge (Final Oxygen Re-Evaluation):   Initial Exercise Prescription:  Initial Exercise Prescription - 08/09/23 1500       Date of Initial Exercise RX and Referring Provider   Date 08/09/23    Referring Provider Barnet Cough MD      NuStep   Level 1    SPM 50    Minutes 15    METs 1.9      Track   Laps 10    Minutes 15    METs 1.9      Prescription Details   Frequency (times per week) 3    Duration Progress to 30 minutes of continuous aerobic without signs/symptoms of physical distress      Intensity   THRR 40-80% of Max Heartrate 106-129    Ratings of Perceived Exertion 11-13    Perceived Dyspnea 0-4       Resistance Training   Training Prescription Yes    Weight 3    Reps 10-15          Perform Capillary Blood Glucose checks as needed.  Exercise Prescription Changes:   Exercise Prescription Changes     Row Name 08/09/23 1500 08/17/23 1000           Response to Exercise   Blood Pressure (Admit) 150/80 124/64      Blood Pressure (Exercise) 160/76 148/80      Blood Pressure (Exit) 140/78 124/80      Heart Rate (Admit) 82 bpm 68 bpm      Heart Rate (Exercise) 102 bpm 1113 bpm      Heart Rate (Exit) 86 bpm 77 bpm  Oxygen Saturation (Admit) 95 % --      Oxygen Saturation (Exercise) 96 % --      Oxygen Saturation (Exit) 96 % --      Rating of Perceived Exertion (Exercise) 12 12      Perceived Dyspnea (Exercise) 1 --      Duration Progress to 30 minutes of  aerobic without signs/symptoms of physical distress Continue with 30 min of aerobic exercise without signs/symptoms of physical distress.      Intensity -- THRR unchanged        Progression   Progression -- Continue to progress workloads to maintain intensity without signs/symptoms of physical distress.        Resistance Training   Training Prescription -- Yes      Weight -- 3      Reps -- 10-15        NuStep   Level -- 1      SPM -- 73      Minutes -- 15      METs -- 1.7        Track   Laps -- 14      Minutes -- 15      METs -- 1.6         Exercise Comments:   Exercise Comments     Row Name 07/21/23 1313 08/16/23 1044         Exercise Comments Currently doing exercise that the PT sent home with her. First full day of exercise!  Patient was oriented to gym and equipment including functions, settings, policies, and procedures.  Patient's individual exercise prescription and treatment plan were reviewed.  All starting workloads were established based on the results of the 6 minute walk test done at initial orientation visit.  The plan for exercise progression was also introduced and progression will  be customized based on patient's performance and goals.         Exercise Goals and Review:   Exercise Goals     Row Name 07/21/23 1317             Exercise Goals   Increase Physical Activity Yes       Intervention Provide advice, education, support and counseling about physical activity/exercise needs.;Develop an individualized exercise prescription for aerobic and resistive training based on initial evaluation findings, risk stratification, comorbidities and participant's personal goals.       Expected Outcomes Long Term: Exercising regularly at least 3-5 days a week.;Long Term: Add in home exercise to make exercise part of routine and to increase amount of physical activity.;Short Term: Attend rehab on a regular basis to increase amount of physical activity.       Increase Strength and Stamina Yes       Intervention Provide advice, education, support and counseling about physical activity/exercise needs.;Develop an individualized exercise prescription for aerobic and resistive training based on initial evaluation findings, risk stratification, comorbidities and participant's personal goals.       Expected Outcomes Short Term: Increase workloads from initial exercise prescription for resistance, speed, and METs.;Long Term: Improve cardiorespiratory fitness, muscular endurance and strength as measured by increased METs and functional capacity ( );Short Term: Perform resistance training exercises routinely during rehab and add in resistance training at home       Able to understand and use rate of perceived exertion (RPE) scale Yes       Intervention Provide education and explanation on how to use RPE scale       Expected  Outcomes Short Term: Able to use RPE daily in rehab to express subjective intensity level;Long Term:  Able to use RPE to guide intensity level when exercising independently       Able to understand and use Dyspnea scale Yes       Intervention Provide education and  explanation on how to use Dyspnea scale       Expected Outcomes Short Term: Able to use Dyspnea scale daily in rehab to express subjective sense of shortness of breath during exertion;Long Term: Able to use Dyspnea scale to guide intensity level when exercising independently       Knowledge and understanding of Target Heart Rate Range (THRR) Yes       Intervention Provide education and explanation of THRR including how the numbers were predicted and where they are located for reference       Expected Outcomes Short Term: Able to state/look up THRR;Short Term: Able to use daily as guideline for intensity in rehab;Long Term: Able to use THRR to govern intensity when exercising independently       Able to check pulse independently Yes       Intervention Provide education and demonstration on how to check pulse in carotid and radial arteries.;Review the importance of being able to check your own pulse for safety during independent exercise       Expected Outcomes Long Term: Able to check pulse independently and accurately;Short Term: Able to explain why pulse checking is important during independent exercise       Understanding of Exercise Prescription Yes       Intervention Provide education, explanation, and written materials on patient's individual exercise prescription       Expected Outcomes Long Term: Able to explain home exercise prescription to exercise independently;Short Term: Able to explain program exercise prescription          Exercise Goals Re-Evaluation :  Exercise Goals Re-Evaluation     Row Name 08/16/23 1044             Exercise Goal Re-Evaluation   Exercise Goals Review Able to understand and use Dyspnea scale;Able to understand and use rate of perceived exertion (RPE) scale;Knowledge and understanding of Target Heart Rate Range (THRR)       Comments Reviewed RPE and dyspnea scale, THR and program prescription with pt today.  Pt voiced understanding and was given a copy of  goals to take home.       Expected Outcomes Short: Use RPE daily to regulate intensity.  Long: Follow program prescription in THR.           Discharge Exercise Prescription (Final Exercise Prescription Changes):  Exercise Prescription Changes - 08/17/23 1000       Response to Exercise   Blood Pressure (Admit) 124/64    Blood Pressure (Exercise) 148/80    Blood Pressure (Exit) 124/80    Heart Rate (Admit) 68 bpm    Heart Rate (Exercise) 1113 bpm    Heart Rate (Exit) 77 bpm    Rating of Perceived Exertion (Exercise) 12    Duration Continue with 30 min of aerobic exercise without signs/symptoms of physical distress.    Intensity THRR unchanged      Progression   Progression Continue to progress workloads to maintain intensity without signs/symptoms of physical distress.      Resistance Training   Training Prescription Yes    Weight 3    Reps 10-15      NuStep   Level 1  SPM 73    Minutes 15    METs 1.7      Track   Laps 14    Minutes 15    METs 1.6          Nutrition:  Target Goals: Understanding of nutrition guidelines, daily intake of sodium 1500mg , cholesterol 200mg , calories 30% from fat and 7% or less from saturated fats, daily to have 5 or more servings of fruits and vegetables.  Biometrics:  Pre Biometrics - 08/09/23 1543       Pre Biometrics   Height 5' 0.5 (1.537 m)    Weight 74.8 kg    Waist Circumference 30 inches    Hip Circumference 43 inches    Waist to Hip Ratio 0.7 %    BMI (Calculated) 31.66    Grip Strength 10.1 kg           Nutrition Therapy Plan and Nutrition Goals:  Nutrition Therapy & Goals - 07/21/23 1318       Intervention Plan   Intervention Prescribe, educate and counsel regarding individualized specific dietary modifications aiming towards targeted core components such as weight, hypertension, lipid management, diabetes, heart failure and other comorbidities.;Nutrition handout(s) given to patient.    Expected Outcomes  Short Term Goal: Understand basic principles of dietary content, such as calories, fat, sodium, cholesterol and nutrients.;Short Term Goal: A plan has been developed with personal nutrition goals set during dietitian appointment.;Long Term Goal: Adherence to prescribed nutrition plan.          Nutrition Assessments:  MEDIFICTS Score Key: >=70 Need to make dietary changes  40-70 Heart Healthy Diet <= 40 Therapeutic Level Cholesterol Diet   Picture Your Plate Scores: <59 Unhealthy dietary pattern with much room for improvement. 41-50 Dietary pattern unlikely to meet recommendations for good health and room for improvement. 51-60 More healthful dietary pattern, with some room for improvement.  >60 Healthy dietary pattern, although there may be some specific behaviors that could be improved.    Nutrition Goals Re-Evaluation:   Nutrition Goals Discharge (Final Nutrition Goals Re-Evaluation):   Psychosocial: Target Goals: Acknowledge presence or absence of significant depression and/or stress, maximize coping skills, provide positive support system. Participant is able to verbalize types and ability to use techniques and skills needed for reducing stress and depression.  Initial Review & Psychosocial Screening:  Initial Psych Review & Screening - 07/21/23 1320       Family Dynamics   Comments Daughter is main support system, she lives alone.          Quality of Life Scores:  Scores of 19 and below usually indicate a poorer quality of life in these areas.  A difference of  2-3 points is a clinically meaningful difference.  A difference of 2-3 points in the total score of the Quality of Life Index has been associated with significant improvement in overall quality of life, self-image, physical symptoms, and general health in studies assessing change in quality of life.  PHQ-9: Review Flowsheet       08/09/2023  Depression screen PHQ 2/9  Decreased Interest 0  Down,  Depressed, Hopeless 0  PHQ - 2 Score 0  Altered sleeping 2  Tired, decreased energy 3  Change in appetite 0  Feeling bad or failure about yourself  0  Trouble concentrating 1  Moving slowly or fidgety/restless 0  Suicidal thoughts 0  PHQ-9 Score 6  Difficult doing work/chores Not difficult at all   Interpretation of Total Score  Total  Score Depression Severity:  1-4 = Minimal depression, 5-9 = Mild depression, 10-14 = Moderate depression, 15-19 = Moderately severe depression, 20-27 = Severe depression   Psychosocial Evaluation and Intervention:  Psychosocial Evaluation - 07/21/23 1320       Psychosocial Evaluation & Interventions   Interventions Encouraged to exercise with the program and follow exercise prescription    Comments Ms. Laningham is a pleasant lady who is eager to start our program. 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 She was discharged on 06/21/23 to a SNF in [place] briefly for a week. PT encouraged walking with a rollating walker upon discharge. Upon talking with Anne she states that she has had 1 fall in the last year and that was while she was hospitalized. She said it all happened so fast that she doesn't really remember what happened, but that she did get hurt from the fall. She says that she uses the walker as needed. She seems to be a little deconditioned with the history of a fall, arthritis, muscle weakness, etc. She is retired and for fun she likes to shop and decorate. She will be coming in for orientation with an EP on 08/01/23.    Expected Outcomes Short: Completely independent off the walker. Long: Increase strength and endurance.    Continue Psychosocial Services  Follow up required by staff          Psychosocial Re-Evaluation:   Psychosocial Discharge (Final Psychosocial Re-Evaluation):   Vocational Rehabilitation: Provide vocational rehab assistance to qualifying candidates.   Vocational Rehab Evaluation & Intervention:   Vocational Rehab - 07/21/23 1308       Initial Vocational Rehab Evaluation & Intervention   Assessment shows need for Vocational Rehabilitation No          Education: Education Goals: Education classes will be provided on a weekly basis, covering required topics. Participant will state understanding/return demonstration of topics presented.  Learning Barriers/Preferences:  Learning Barriers/Preferences - 07/21/23 1307       Learning Barriers/Preferences   Learning Barriers Sight   Wears glasses   Learning Preferences Skilled Demonstration;Video;Group Instruction;Audio;Written Material          Education Topics: Hypertension, Hypertension Reduction -Define heart disease and high blood pressure. Discus how high blood pressure affects the body and ways to reduce high blood pressure.   Exercise and Your Heart -Discuss why it is important to exercise, the FITT principles of exercise, normal and abnormal responses to exercise, and how to exercise safely.   Angina -Discuss definition of angina, causes of angina, treatment of angina, and how to decrease risk of having angina.   Cardiac Medications -Review what the following cardiac medications are used for, how they affect the body, and side effects that may occur when taking the medications.  Medications include Aspirin, Beta blockers, calcium channel blockers, ACE Inhibitors, angiotensin receptor blockers, diuretics, digoxin, and antihyperlipidemics. Flowsheet Row CARDIAC REHAB PHASE II EXERCISE from 08/16/2023 in [facility]  Date 08/16/23  Educator jh  Instruction Review Code 1- Verbalizes Understanding    Congestive Heart Failure -Discuss the definition of CHF, how to live with CHF, the signs and symptoms of CHF, and how keep track of weight and sodium intake.   Heart Disease and Intimacy -Discus the effect sexual activity has on the heart, how changes occur during intimacy as we age, and safety  during sexual activity.   Smoking Cessation / COPD -Discuss different methods to quit smoking, the health benefits  of quitting smoking, and the definition of COPD.   Nutrition I: Fats -Discuss the types of cholesterol, what cholesterol does to the heart, and how cholesterol levels can be controlled.   Nutrition II: Labels -Discuss the different components of food labels and how to read food label   Heart Parts/Heart Disease and PAD -Discuss the anatomy of the heart, the pathway of blood circulation through the heart, and these are affected by heart disease.   Stress I: Signs and Symptoms -Discuss the causes of stress, how stress may lead to anxiety and depression, and ways to limit stress.   Stress II: Relaxation -Discuss different types of relaxation techniques to limit stress.   Warning Signs of Stroke / TIA -Discuss definition of a stroke, what the signs and symptoms are of a stroke, and how to identify when someone is having stroke.   Knowledge Questionnaire Score:   Core Components/Risk Factors/Patient Goals at Admission:  Personal Goals and Risk Factors at Admission - 07/21/23 1308       Core Components/Risk Factors/Patient Goals on Admission   Improve shortness of breath with ADL's Yes    Intervention Provide education, individualized exercise plan and daily activity instruction to help decrease symptoms of SOB with activities of daily living.    Expected Outcomes Short Term: Improve cardiorespiratory fitness to achieve a reduction of symptoms when performing ADLs;Long Term: Be able to perform more ADLs without symptoms or delay the onset of symptoms    Increase knowledge of respiratory medications and ability to use respiratory devices properly  Yes    Intervention Provide education and demonstration as needed of appropriate use of medications, inhalers, and oxygen therapy.    Expected Outcomes Long Term: Maintain appropriate use of medications, inhalers, and  oxygen therapy.;Short Term: Achieves understanding of medications use. Understands that oxygen is a medication prescribed by physician. Demonstrates appropriate use of inhaler and oxygen therapy.    Diabetes Yes    Intervention Provide education about signs/symptoms and action to take for hypo/hyperglycemia.;Provide education about proper nutrition, including hydration, and aerobic/resistive exercise prescription along with prescribed medications to achieve blood glucose in normal ranges: Fasting glucose 65-99 mg/dL    Expected Outcomes Short Term: Participant verbalizes understanding of the signs/symptoms and immediate care of hyper/hypoglycemia, proper foot care and importance of medication, aerobic/resistive exercise and nutrition plan for blood glucose control.;Long Term: Attainment of HbA1C < 7%.    Hypertension Yes    Intervention Provide education on lifestyle modifcations including regular physical activity/exercise, weight management, moderate sodium restriction and increased consumption of fresh fruit, vegetables, and low fat dairy, alcohol moderation, and smoking cessation.;Monitor prescription use compliance.    Expected Outcomes Long Term: Maintenance of blood pressure at goal levels.;Short Term: Continued assessment and intervention until BP is < 140/70mm HG in hypertensive participants. < 130/47mm HG in hypertensive participants with diabetes, heart failure or chronic kidney disease.    Stress Yes    Intervention Offer individual and/or small group education and counseling on adjustment to heart disease, stress management and health-related lifestyle change. Teach and support self-help strategies.;Refer participants experiencing significant psychosocial distress to appropriate mental health specialists for further evaluation and treatment. When possible, include family members and significant others in education/counseling sessions.    Expected Outcomes Short Term: Participant demonstrates  changes in health-related behavior, relaxation and other stress management skills, ability to obtain effective social support, and compliance with psychotropic medications if prescribed.;Long Term: Emotional wellbeing is indicated by absence of clinically significant psychosocial distress or social  isolation.          Core Components/Risk Factors/Patient Goals Review:    Core Components/Risk Factors/Patient Goals at Discharge (Final Review):    ITP Comments:  ITP Comments     Row Name 07/21/23 1320 08/16/23 1043 09/06/23 0903 09/15/23 1106     ITP Comments Completed virtual orientation today.  EP evaluation is scheduled for 08/01/23 at 1:00pm.  Documentation for diagnosis can be found in [facility] encounter 06/13/23. First full day of exercise!  Patient was oriented to gym and equipment including functions, settings, policies, and procedures.  Patient's individual exercise prescription and treatment plan were reviewed.  All starting workloads were established based on the results of the 6 minute walk test done at initial orientation visit.  The plan for exercise progression was also introduced and progression will be customized based on patient's performance and goals. 30 day review completed. ITP sent to Dr. Dorn Ross, Medical Director of Cardiac Rehab. Continue with ITP unless changes are made by physician. Patient is new to the program and has only attended 2 sessions after her orientation. She has been contacted and says she has MD appointments but attendance remains inconsistent. Attempted to call patient regarding cardiac rehab. Mailbox is full. She returned call and said she was not going to be able to complete the program. She recently saw her cardiologist and they discussed with her daughter placement in an assisted living facility in [place], [place]. She got lost driving from [place] back to her home in [place]. MD did not recommended driving. Discharging patient.       Comments:  Discharge ITP.

## 2023-09-15 NOTE — Progress Notes (Signed)
 Discharge Progress Report  Patient Details  Name: Adriana Wiley MRN: [id number] Date of Birth: 12-Dec-1945 Referring Provider:   Flowsheet Row CARDIAC REHAB PHASE II ORIENTATION from 08/09/2023 in [facility]  Referring Provider Barnet Cough MD     Number of Visits: 3  Reason for Discharge:  Early Exit:  Placement in Assisted Living Facility and Driving concerns.   Smoking History:  Social History   Tobacco Use  Smoking Status Never  Smokeless Tobacco Not on file    Diagnosis:  S/P TAVR (transcatheter aortic valve replacement)  ADL UCSD:   Initial Exercise Prescription:  Initial Exercise Prescription - 08/09/23 1500       Date of Initial Exercise RX and Referring Provider   Date 08/09/23    Referring Provider Barnet Cough MD      NuStep   Level 1    SPM 50    Minutes 15    METs 1.9      Track   Laps 10    Minutes 15    METs 1.9      Prescription Details   Frequency (times per week) 3    Duration Progress to 30 minutes of continuous aerobic without signs/symptoms of physical distress      Intensity   THRR 40-80% of Max Heartrate 106-129    Ratings of Perceived Exertion 11-13    Perceived Dyspnea 0-4      Resistance Training   Training Prescription Yes    Weight 3    Reps 10-15          Discharge Exercise Prescription (Final Exercise Prescription Changes):  Exercise Prescription Changes - 08/17/23 1000       Response to Exercise   Blood Pressure (Admit) 124/64    Blood Pressure (Exercise) 148/80    Blood Pressure (Exit) 124/80    Heart Rate (Admit) 68 bpm    Heart Rate (Exercise) 1113 bpm    Heart Rate (Exit) 77 bpm    Rating of Perceived Exertion (Exercise) 12    Duration Continue with 30 min of aerobic exercise without signs/symptoms of physical distress.    Intensity THRR unchanged      Progression   Progression Continue to progress workloads to maintain intensity without signs/symptoms of physical  distress.      Resistance Training   Training Prescription Yes    Weight 3    Reps 10-15      NuStep   Level 1    SPM 73    Minutes 15    METs 1.7      Track   Laps 14    Minutes 15    METs 1.6          Functional Capacity:  6 Minute Walk     Row Name 08/09/23 1537         6 Minute Walk   Phase Initial     Distance 900 feet     Walk Time 6 minutes     # of Rest Breaks 0     MPH 1.7     METS 1.62     RPE 12     Perceived Dyspnea  1     VO2 Peak 5.68     Symptoms No     Resting HR 82 bpm     Resting BP 150/80     Resting Oxygen Saturation  95 %     Exercise Oxygen Saturation  during 6 min walk 96 %  Max Ex. HR 102 bpm     Max Ex. BP 160/76     2 Minute Post BP 140/78        Psychological, QOL, Others - Outcomes: PHQ 2/9:    08/09/2023    3:00 PM  Depression screen PHQ 2/9  Decreased Interest 0  Down, Depressed, Hopeless 0  PHQ - 2 Score 0  Altered sleeping 2  Tired, decreased energy 3  Change in appetite 0  Feeling bad or failure about yourself  0  Trouble concentrating 1  Moving slowly or fidgety/restless 0  Suicidal thoughts 0  PHQ-9 Score 6  Difficult doing work/chores Not difficult at all    Quality of Life:   Personal Goals: Goals established at orientation with interventions provided to work toward goal.  Personal Goals and Risk Factors at Admission - 07/21/23 1308       Core Components/Risk Factors/Patient Goals on Admission   Improve shortness of breath with ADL's Yes    Intervention Provide education, individualized exercise plan and daily activity instruction to help decrease symptoms of SOB with activities of daily living.    Expected Outcomes Short Term: Improve cardiorespiratory fitness to achieve a reduction of symptoms when performing ADLs;Long Term: Be able to perform more ADLs without symptoms or delay the onset of symptoms    Increase knowledge of respiratory medications and ability to use respiratory devices properly   Yes    Intervention Provide education and demonstration as needed of appropriate use of medications, inhalers, and oxygen therapy.    Expected Outcomes Long Term: Maintain appropriate use of medications, inhalers, and oxygen therapy.;Short Term: Achieves understanding of medications use. Understands that oxygen is a medication prescribed by physician. Demonstrates appropriate use of inhaler and oxygen therapy.    Diabetes Yes    Intervention Provide education about signs/symptoms and action to take for hypo/hyperglycemia.;Provide education about proper nutrition, including hydration, and aerobic/resistive exercise prescription along with prescribed medications to achieve blood glucose in normal ranges: Fasting glucose 65-99 mg/dL    Expected Outcomes Short Term: Participant verbalizes understanding of the signs/symptoms and immediate care of hyper/hypoglycemia, proper foot care and importance of medication, aerobic/resistive exercise and nutrition plan for blood glucose control.;Long Term: Attainment of HbA1C < 7%.    Hypertension Yes    Intervention Provide education on lifestyle modifcations including regular physical activity/exercise, weight management, moderate sodium restriction and increased consumption of fresh fruit, vegetables, and low fat dairy, alcohol moderation, and smoking cessation.;Monitor prescription use compliance.    Expected Outcomes Long Term: Maintenance of blood pressure at goal levels.;Short Term: Continued assessment and intervention until BP is < 140/69mm HG in hypertensive participants. < 130/101mm HG in hypertensive participants with diabetes, heart failure or chronic kidney disease.    Stress Yes    Intervention Offer individual and/or small group education and counseling on adjustment to heart disease, stress management and health-related lifestyle change. Teach and support self-help strategies.;Refer participants experiencing significant psychosocial distress to appropriate  mental health specialists for further evaluation and treatment. When possible, include family members and significant others in education/counseling sessions.    Expected Outcomes Short Term: Participant demonstrates changes in health-related behavior, relaxation and other stress management skills, ability to obtain effective social support, and compliance with psychotropic medications if prescribed.;Long Term: Emotional wellbeing is indicated by absence of clinically significant psychosocial distress or social isolation.           Personal Goals Discharge:   Exercise Goals and Review:  Exercise Goals  Row Name 07/21/23 1317             Exercise Goals   Increase Physical Activity Yes       Intervention Provide advice, education, support and counseling about physical activity/exercise needs.;Develop an individualized exercise prescription for aerobic and resistive training based on initial evaluation findings, risk stratification, comorbidities and participant's personal goals.       Expected Outcomes Long Term: Exercising regularly at least 3-5 days a week.;Long Term: Add in home exercise to make exercise part of routine and to increase amount of physical activity.;Short Term: Attend rehab on a regular basis to increase amount of physical activity.       Increase Strength and Stamina Yes       Intervention Provide advice, education, support and counseling about physical activity/exercise needs.;Develop an individualized exercise prescription for aerobic and resistive training based on initial evaluation findings, risk stratification, comorbidities and participant's personal goals.       Expected Outcomes Short Term: Increase workloads from initial exercise prescription for resistance, speed, and METs.;Long Term: Improve cardiorespiratory fitness, muscular endurance and strength as measured by increased METs and functional capacity ( );Short Term: Perform resistance training exercises  routinely during rehab and add in resistance training at home       Able to understand and use rate of perceived exertion (RPE) scale Yes       Intervention Provide education and explanation on how to use RPE scale       Expected Outcomes Short Term: Able to use RPE daily in rehab to express subjective intensity level;Long Term:  Able to use RPE to guide intensity level when exercising independently       Able to understand and use Dyspnea scale Yes       Intervention Provide education and explanation on how to use Dyspnea scale       Expected Outcomes Short Term: Able to use Dyspnea scale daily in rehab to express subjective sense of shortness of breath during exertion;Long Term: Able to use Dyspnea scale to guide intensity level when exercising independently       Knowledge and understanding of Target Heart Rate Range (THRR) Yes       Intervention Provide education and explanation of THRR including how the numbers were predicted and where they are located for reference       Expected Outcomes Short Term: Able to state/look up THRR;Short Term: Able to use daily as guideline for intensity in rehab;Long Term: Able to use THRR to govern intensity when exercising independently       Able to check pulse independently Yes       Intervention Provide education and demonstration on how to check pulse in carotid and radial arteries.;Review the importance of being able to check your own pulse for safety during independent exercise       Expected Outcomes Long Term: Able to check pulse independently and accurately;Short Term: Able to explain why pulse checking is important during independent exercise       Understanding of Exercise Prescription Yes       Intervention Provide education, explanation, and written materials on patient's individual exercise prescription       Expected Outcomes Long Term: Able to explain home exercise prescription to exercise independently;Short Term: Able to explain program exercise  prescription          Exercise Goals Re-Evaluation:  Exercise Goals Re-Evaluation     Row Name 08/16/23 1044  Exercise Goal Re-Evaluation   Exercise Goals Review Able to understand and use Dyspnea scale;Able to understand and use rate of perceived exertion (RPE) scale;Knowledge and understanding of Target Heart Rate Range (THRR)       Comments Reviewed RPE and dyspnea scale, THR and program prescription with pt today.  Pt voiced understanding and was given a copy of goals to take home.       Expected Outcomes Short: Use RPE daily to regulate intensity.  Long: Follow program prescription in THR.          Nutrition & Weight - Outcomes:  Pre Biometrics - 08/09/23 1543       Pre Biometrics   Height 5' 0.5 (1.537 m)    Weight 74.8 kg    Waist Circumference 30 inches    Hip Circumference 43 inches    Waist to Hip Ratio 0.7 %    BMI (Calculated) 31.66    Grip Strength 10.1 kg           Nutrition:  Nutrition Therapy & Goals - 07/21/23 1318       Intervention Plan   Intervention Prescribe, educate and counsel regarding individualized specific dietary modifications aiming towards targeted core components such as weight, hypertension, lipid management, diabetes, heart failure and other comorbidities.;Nutrition handout(s) given to patient.    Expected Outcomes Short Term Goal: Understand basic principles of dietary content, such as calories, fat, sodium, cholesterol and nutrients.;Short Term Goal: A plan has been developed with personal nutrition goals set during dietitian appointment.;Long Term Goal: Adherence to prescribed nutrition plan.          Nutrition Discharge:   Education Questionnaire Score:  Early Exit:  Placement in Assisted Living Facility and Driving concerns.

## 2023-09-15 NOTE — Addendum Note (Signed)
 Encounter addended by: Vicci Adrien CROME, RN on: 09/15/2023 11:13 AM  Actions taken: Flowsheet accepted, Clinical Note Signed, Episode resolved

## 2023-09-18 ENCOUNTER — Encounter (HOSPITAL_COMMUNITY)

## 2023-09-20 ENCOUNTER — Encounter (HOSPITAL_COMMUNITY)

## 2023-09-25 ENCOUNTER — Encounter (HOSPITAL_COMMUNITY): Payer: Self-pay

## 2023-09-25 ENCOUNTER — Encounter (HOSPITAL_COMMUNITY)

## 2023-09-25 DIAGNOSIS — Z952 Presence of prosthetic heart valve: Secondary | ICD-10-CM

## 2023-09-25 NOTE — Progress Notes (Signed)
 Discharge Progress Report  Patient Details  Name: Adriana Wiley MRN: [id number] Date of Birth: May 02, 1945 Referring Provider:   Flowsheet Row CARDIAC REHAB PHASE II ORIENTATION from 08/09/2023 in [facility] CARDIAC REHABILITATION  Referring Provider Barnet Cough MD     Number of Visits: 2  Reason for Discharge:  Early Exit:  Personal  Smoking History:  Social History   Tobacco Use  Smoking Status Never  Smokeless Tobacco Not on file    Diagnosis:  S/P TAVR (transcatheter aortic valve replacement)  ADL [facility]:   Initial Exercise Prescription:  Initial Exercise Prescription - 08/09/23 1500       Date of Initial Exercise RX and Referring Provider   Date 08/09/23    Referring Provider Barnet Cough MD      NuStep   Level 1    SPM 50    Minutes 15    METs 1.9      Track   Laps 10    Minutes 15    METs 1.9      Prescription Details   Frequency (times per week) 3    Duration Progress to 30 minutes of continuous aerobic without signs/symptoms of physical distress      Intensity   THRR 40-80% of Max Heartrate 106-129    Ratings of Perceived Exertion 11-13    Perceived Dyspnea 0-4      Resistance Training   Training Prescription Yes    Weight 3    Reps 10-15          Discharge Exercise Prescription (Final Exercise Prescription Changes):  Exercise Prescription Changes - 08/17/23 1000       Response to Exercise   Blood Pressure (Admit) 124/64    Blood Pressure (Exercise) 148/80    Blood Pressure (Exit) 124/80    Heart Rate (Admit) 68 bpm    Heart Rate (Exercise) 1113 bpm    Heart Rate (Exit) 77 bpm    Rating of Perceived Exertion (Exercise) 12    Duration Continue with 30 min of aerobic exercise without signs/symptoms of physical distress.    Intensity THRR unchanged      Progression   Progression Continue to progress workloads to maintain intensity without signs/symptoms of physical distress.      Resistance Training   Training  Prescription Yes    Weight 3    Reps 10-15      NuStep   Level 1    SPM 73    Minutes 15    METs 1.7      Track   Laps 14    Minutes 15    METs 1.6          Functional Capacity:  6 Minute Walk     Row Name 08/09/23 1537         6 Minute Walk   Phase Initial     Distance 900 feet     Walk Time 6 minutes     # of Rest Breaks 0     MPH 1.7     METS 1.62     RPE 12     Perceived Dyspnea  1     VO2 Peak 5.68     Symptoms No     Resting HR 82 bpm     Resting BP 150/80     Resting Oxygen Saturation  95 %     Exercise Oxygen Saturation  during 6 min walk 96 %     Max Ex. HR 102 bpm  Max Ex. BP 160/76     2 Minute Post BP 140/78        Psychological, QOL, Others - Outcomes: PHQ 2/9:    08/09/2023    3:00 PM  Depression screen PHQ 2/9  Decreased Interest 0  Down, Depressed, Hopeless 0  PHQ - 2 Score 0  Altered sleeping 2  Tired, decreased energy 3  Change in appetite 0  Feeling bad or failure about yourself  0  Trouble concentrating 1  Moving slowly or fidgety/restless 0  Suicidal thoughts 0  PHQ-9 Score 6  Difficult doing work/chores Not difficult at all    Quality of Life:   Personal Goals: Goals established at orientation with interventions provided to work toward goal.  Personal Goals and Risk Factors at Admission - 07/21/23 1308       Core Components/Risk Factors/Patient Goals on Admission   Improve shortness of breath with ADL's Yes    Intervention Provide education, individualized exercise plan and daily activity instruction to help decrease symptoms of SOB with activities of daily living.    Expected Outcomes Short Term: Improve cardiorespiratory fitness to achieve a reduction of symptoms when performing ADLs;Long Term: Be able to perform more ADLs without symptoms or delay the onset of symptoms    Increase knowledge of respiratory medications and ability to use respiratory devices properly  Yes    Intervention Provide education and  demonstration as needed of appropriate use of medications, inhalers, and oxygen therapy.    Expected Outcomes Long Term: Maintain appropriate use of medications, inhalers, and oxygen therapy.;Short Term: Achieves understanding of medications use. Understands that oxygen is a medication prescribed by physician. Demonstrates appropriate use of inhaler and oxygen therapy.    Diabetes Yes    Intervention Provide education about signs/symptoms and action to take for hypo/hyperglycemia.;Provide education about proper nutrition, including hydration, and aerobic/resistive exercise prescription along with prescribed medications to achieve blood glucose in normal ranges: Fasting glucose 65-99 mg/dL    Expected Outcomes Short Term: Participant verbalizes understanding of the signs/symptoms and immediate care of hyper/hypoglycemia, proper foot care and importance of medication, aerobic/resistive exercise and nutrition plan for blood glucose control.;Long Term: Attainment of HbA1C < 7%.    Hypertension Yes    Intervention Provide education on lifestyle modifcations including regular physical activity/exercise, weight management, moderate sodium restriction and increased consumption of fresh fruit, vegetables, and low fat dairy, alcohol moderation, and smoking cessation.;Monitor prescription use compliance.    Expected Outcomes Long Term: Maintenance of blood pressure at goal levels.;Short Term: Continued assessment and intervention until BP is < 140/61mm HG in hypertensive participants. < 130/40mm HG in hypertensive participants with diabetes, heart failure or chronic kidney disease.    Stress Yes    Intervention Offer individual and/or small group education and counseling on adjustment to heart disease, stress management and health-related lifestyle change. Teach and support self-help strategies.;Refer participants experiencing significant psychosocial distress to appropriate mental health specialists for further  evaluation and treatment. When possible, include family members and significant others in education/counseling sessions.    Expected Outcomes Short Term: Participant demonstrates changes in health-related behavior, relaxation and other stress management skills, ability to obtain effective social support, and compliance with psychotropic medications if prescribed.;Long Term: Emotional wellbeing is indicated by absence of clinically significant psychosocial distress or social isolation.           Personal Goals Discharge:   Exercise Goals and Review:  Exercise Goals     Row Name 07/21/23 1317  Exercise Goals   Increase Physical Activity Yes       Intervention Provide advice, education, support and counseling about physical activity/exercise needs.;Develop an individualized exercise prescription for aerobic and resistive training based on initial evaluation findings, risk stratification, comorbidities and participant's personal goals.       Expected Outcomes Long Term: Exercising regularly at least 3-5 days a week.;Long Term: Add in home exercise to make exercise part of routine and to increase amount of physical activity.;Short Term: Attend rehab on a regular basis to increase amount of physical activity.       Increase Strength and Stamina Yes       Intervention Provide advice, education, support and counseling about physical activity/exercise needs.;Develop an individualized exercise prescription for aerobic and resistive training based on initial evaluation findings, risk stratification, comorbidities and participant's personal goals.       Expected Outcomes Short Term: Increase workloads from initial exercise prescription for resistance, speed, and METs.;Long Term: Improve cardiorespiratory fitness, muscular endurance and strength as measured by increased METs and functional capacity ( );Short Term: Perform resistance training exercises routinely during rehab and add in  resistance training at home       Able to understand and use rate of perceived exertion (RPE) scale Yes       Intervention Provide education and explanation on how to use RPE scale       Expected Outcomes Short Term: Able to use RPE daily in rehab to express subjective intensity level;Long Term:  Able to use RPE to guide intensity level when exercising independently       Able to understand and use Dyspnea scale Yes       Intervention Provide education and explanation on how to use Dyspnea scale       Expected Outcomes Short Term: Able to use Dyspnea scale daily in rehab to express subjective sense of shortness of breath during exertion;Long Term: Able to use Dyspnea scale to guide intensity level when exercising independently       Knowledge and understanding of Target Heart Rate Range (THRR) Yes       Intervention Provide education and explanation of THRR including how the numbers were predicted and where they are located for reference       Expected Outcomes Short Term: Able to state/look up THRR;Short Term: Able to use daily as guideline for intensity in rehab;Long Term: Able to use THRR to govern intensity when exercising independently       Able to check pulse independently Yes       Intervention Provide education and demonstration on how to check pulse in carotid and radial arteries.;Review the importance of being able to check your own pulse for safety during independent exercise       Expected Outcomes Long Term: Able to check pulse independently and accurately;Short Term: Able to explain why pulse checking is important during independent exercise       Understanding of Exercise Prescription Yes       Intervention Provide education, explanation, and written materials on patient's individual exercise prescription       Expected Outcomes Long Term: Able to explain home exercise prescription to exercise independently;Short Term: Able to explain program exercise prescription          Exercise  Goals Re-Evaluation:  Exercise Goals Re-Evaluation     Row Name 08/16/23 1044             Exercise Goal Re-Evaluation   Exercise Goals Review Able to understand and  use Dyspnea scale;Able to understand and use rate of perceived exertion (RPE) scale;Knowledge and understanding of Target Heart Rate Range (THRR)       Comments Reviewed RPE and dyspnea scale, THR and program prescription with pt today.  Pt voiced understanding and was given a copy of goals to take home.       Expected Outcomes Short: Use RPE daily to regulate intensity.  Long: Follow program prescription in THR.          Nutrition & Weight - Outcomes:  Pre Biometrics - 08/09/23 1543       Pre Biometrics   Height 5' 0.5 (1.537 m)    Weight 164 lb 14.5 oz (74.8 kg)    Waist Circumference 30 inches    Hip Circumference 43 inches    Waist to Hip Ratio 0.7 %    BMI (Calculated) 31.66    Grip Strength 10.1 kg           Nutrition:  Nutrition Therapy & Goals - 07/21/23 1318       Intervention Plan   Intervention Prescribe, educate and counsel regarding individualized specific dietary modifications aiming towards targeted core components such as weight, hypertension, lipid management, diabetes, heart failure and other comorbidities.;Nutrition handout(s) given to patient.    Expected Outcomes Short Term Goal: Understand basic principles of dietary content, such as calories, fat, sodium, cholesterol and nutrients.;Short Term Goal: A plan has been developed with personal nutrition goals set during dietitian appointment.;Long Term Goal: Adherence to prescribed nutrition plan.          Nutrition Discharge:   Education Questionnaire Score:   Goals reviewed with patient; copy given to patient.

## 2023-09-25 NOTE — Progress Notes (Signed)
 Cardiac Individual Treatment Plan  Patient Details  Name: Adriana Wiley MRN: [id number] Date of Birth: 1945/10/17 Referring Provider:   Flowsheet Row CARDIAC REHAB PHASE II ORIENTATION from 08/09/2023 in [facility] CARDIAC [facility]ILITATION  Referring Provider Barnet Cough MD    Initial Encounter Date:  Flowsheet Row CARDIAC REHAB PHASE II ORIENTATION from 08/09/2023 in [facility]  Date 08/09/23    Visit Diagnosis: S/P TAVR (transcatheter aortic valve replacement)  Patient's Home Medications on Admission:  Current Outpatient Medications:    albuterol (VENTOLIN HFA) 108 (90 Base) MCG/ACT inhaler, Inhale 1 puff into the lungs., Disp: , Rfl:    amiodarone (PACERONE) 200 MG tablet, Take by mouth., Disp: , Rfl:    anastrozole (ARIMIDEX) 1 MG tablet, Take 1 tablet by mouth daily., Disp: , Rfl:    aspirin 81 MG tablet, Take 81 mg by mouth daily., Disp: , Rfl:    BD INSULIN SYRINGE U/F 31G X 5/16 1 ML MISC, Use one syringe daily with Lantus injection., Disp: , Rfl:    fluticasone (FLONASE) 50 MCG/ACT nasal spray, Place 2 sprays into the nose daily., Disp: , Rfl:    fluticasone (FLOVENT HFA) 220 MCG/ACT inhaler, Inhale 1 puff into the lungs 2 (two) times daily., Disp: , Rfl:    furosemide (LASIX) 20 MG tablet, Take 20 mg by mouth., Disp: , Rfl:    gabapentin (NEURONTIN) 100 MG capsule, Take 100 mg by mouth 3 (three) times daily., Disp: , Rfl:    glucose blood (ACCU-CHEK AVIVA PLUS) test strip, USE TO CHECK GLUCOSE LEVELS TWICE DAILY, Disp: , Rfl:    glucose blood (ONETOUCH ULTRA TEST) test strip, Test daily before breakfast. E11.9, Disp: , Rfl:    insulin aspart (NOVOLOG) 100 UNIT/ML injection, Inject 10 Units into the skin 2 (two) times daily between meals., Disp: , Rfl:    insulin glargine (LANTUS) 100 UNIT/ML injection, Inject 50 Units into the skin daily., Disp: , Rfl:    meclizine (ANTIVERT) 25 MG tablet, Take 25 mg by mouth 4 (four) times daily as needed.,  Disp: , Rfl:    metFORMIN (GLUCOPHAGE) 500 MG tablet, Take 500 mg by mouth daily with breakfast., Disp: , Rfl:    omeprazole (PRILOSEC) 20 MG capsule, Take 20 mg by mouth daily., Disp: , Rfl:    oxyCODONE -acetaminophen  (PERCOCET) 5-325 MG per tablet, 1-2 tabs po q6 hours prn pain, Disp: 50 tablet, Rfl: 0   pantoprazole (PROTONIX) 40 MG tablet, Take 1 tablet by mouth daily., Disp: , Rfl:    predniSONE  (DELTASONE ) 10 MG tablet, Take 2 tablets (20 mg total) by mouth daily., Disp: 10 tablet, Rfl: 0   simvastatin (ZOCOR) 20 MG tablet, Take 20 mg by mouth at bedtime., Disp: , Rfl:   Past Medical History: Past Medical History:  Diagnosis Date   Depression    Diabetes mellitus without complication (HCC)    GERD (gastroesophageal reflux disease)    Hyperlipemia    Neuropathy, peripheral    Sleep apnea    uses a cpap   Vertigo    chronic   Wears glasses     Tobacco Use: Social History   Tobacco Use  Smoking Status Never  Smokeless Tobacco Not on file    Labs: Review Flowsheet       Latest Ref Rng & Units 11/22/2009  Labs for ITP Cardiac and Pulmonary Rehab  Cholestrol 0 - 200 mg/dL 816        ATP III CLASSIFICATION:  <200  mg/dL   Desirable  799-760  mg/dL   Borderline High  >=759    mg/dL   High          LDL (calc) 0 - 99 mg/dL 877        Total Cholesterol/HDL:CHD Risk Coronary Heart Disease Risk Table                     Men   Women  1/2 Average Risk   3.4   3.3  Average Risk       5.0   4.4  2 X Average Risk   9.6   7.1  3 X Average Risk  23.4   11.0        Use the calculated Patient Ratio above and the CHD Risk Table to determine the patient's CHD Risk.        ATP III CLASSIFICATION (LDL):  <100     mg/dL   Optimal  899-870  mg/dL   Near or Above                    Optimal  130-159  mg/dL   Borderline  839-810  mg/dL   High  >809     mg/dL   Very High   HDL-C >60 mg/dL 37   Trlycerides <849 mg/dL 877   Hemoglobin J8r <4.2 % 6.2 (NOTE)                                                                        According to the ADA Clinical Practice Recommendations for 2011, when HbA1c is used as a screening test:   >=6.5%   Diagnostic of Diabetes Mellitus           (if abnormal result  is confirmed)  5.7-6.4%   Increased risk of developing Diabetes Mellitus  References:Diagnosis and Classification of Diabetes Mellitus,Diabetes Care,2011,34(Suppl 1):S62-S69 and Standards of Medical Care in         Diabetes - 2011,Diabetes Care,2011,34  (Suppl 1):S11-S61.     Capillary Blood Glucose: Lab Results  Component Value Date   GLUCAP 105 (H) 08/18/2023   GLUCAP 114 (H) 08/18/2023   GLUCAP 118 (H) 08/16/2023   GLUCAP 112 (H) 08/16/2023   GLUCAP 299 (H) 09/12/2017     Exercise Target Goals: Exercise Program Goal: Individual exercise prescription set using results from initial 6 min walk test and THRR while considering  patient's activity barriers and safety.   Exercise Prescription Goal: Starting with aerobic activity 30 plus minutes a day, 3 days per week for initial exercise prescription. Provide home exercise prescription and guidelines that participant acknowledges understanding prior to discharge.  Activity Barriers & Risk Stratification:  Activity Barriers & Cardiac Risk Stratification - 07/21/23 1331       Activity Barriers & Cardiac Risk Stratification   Activity Barriers History of Falls;Assistive Device;Balance Concerns;Arthritis;Deconditioning;Shortness of Breath;Muscular Weakness    Cardiac Risk Stratification Moderate          6 Minute Walk:  6 Minute Walk     Row Name 08/09/23 1537         6 Minute Walk   Phase Initial     Distance 900 feet     Walk  Time 6 minutes     # of Rest Breaks 0     MPH 1.7     METS 1.62     RPE 12     Perceived Dyspnea  1     VO2 Peak 5.68     Symptoms No     Resting HR 82 bpm     Resting BP 150/80     Resting Oxygen Saturation  95 %     Exercise Oxygen Saturation  during 6 min walk 96 %      Max Ex. HR 102 bpm     Max Ex. BP 160/76     2 Minute Post BP 140/78        Oxygen Initial Assessment:  Oxygen Initial Assessment - 07/21/23 1311       Home Oxygen   Home Oxygen Device Portable Concentrator    Sleep Oxygen Prescription CPAP    Home Exercise Oxygen Prescription None    Home Resting Oxygen Prescription Continuous    Compliance with Home Oxygen Use Yes      Intervention   Short Term Goals To learn and exhibit compliance with exercise, home and travel O2 prescription;To learn and understand importance of monitoring SPO2 with pulse oximeter and demonstrate accurate use of the pulse oximeter.;To learn and understand importance of maintaining oxygen saturations>88%;To learn and demonstrate proper use of respiratory medications;To learn and demonstrate proper pursed lip breathing techniques or other breathing techniques.     Long  Term Goals Exhibits compliance with exercise, home  and travel O2 prescription;Verbalizes importance of monitoring SPO2 with pulse oximeter and return demonstration;Maintenance of O2 saturations>88%;Exhibits proper breathing techniques, such as pursed lip breathing or other method taught during program session;Compliance with respiratory medication;Demonstrates proper use of MDI's          Oxygen Re-Evaluation:   Oxygen Discharge (Final Oxygen Re-Evaluation):   Initial Exercise Prescription:  Initial Exercise Prescription - 08/09/23 1500       Date of Initial Exercise RX and Referring Provider   Date 08/09/23    Referring Provider Barnet Cough MD      NuStep   Level 1    SPM 50    Minutes 15    METs 1.9      Track   Laps 10    Minutes 15    METs 1.9      Prescription Details   Frequency (times per week) 3    Duration Progress to 30 minutes of continuous aerobic without signs/symptoms of physical distress      Intensity   THRR 40-80% of Max Heartrate 106-129    Ratings of Perceived Exertion 11-13    Perceived Dyspnea 0-4       Resistance Training   Training Prescription Yes    Weight 3    Reps 10-15          Perform Capillary Blood Glucose checks as needed.  Exercise Prescription Changes:   Exercise Prescription Changes     Row Name 08/09/23 1500 08/17/23 1000           Response to Exercise   Blood Pressure (Admit) 150/80 124/64      Blood Pressure (Exercise) 160/76 148/80      Blood Pressure (Exit) 140/78 124/80      Heart Rate (Admit) 82 bpm 68 bpm      Heart Rate (Exercise) 102 bpm 1113 bpm      Heart Rate (Exit) 86 bpm 77 bpm  Oxygen Saturation (Admit) 95 % --      Oxygen Saturation (Exercise) 96 % --      Oxygen Saturation (Exit) 96 % --      Rating of Perceived Exertion (Exercise) 12 12      Perceived Dyspnea (Exercise) 1 --      Duration Progress to 30 minutes of  aerobic without signs/symptoms of physical distress Continue with 30 min of aerobic exercise without signs/symptoms of physical distress.      Intensity -- THRR unchanged        Progression   Progression -- Continue to progress workloads to maintain intensity without signs/symptoms of physical distress.        Resistance Training   Training Prescription -- Yes      Weight -- 3      Reps -- 10-15        NuStep   Level -- 1      SPM -- 73      Minutes -- 15      METs -- 1.7        Track   Laps -- 14      Minutes -- 15      METs -- 1.6         Exercise Comments:   Exercise Comments     Row Name 07/21/23 1313 08/16/23 1044         Exercise Comments Currently doing exercise that the PT sent home with her. First full day of exercise!  Patient was oriented to gym and equipment including functions, settings, policies, and procedures.  Patient's individual exercise prescription and treatment plan were reviewed.  All starting workloads were established based on the results of the 6 minute walk test done at initial orientation visit.  The plan for exercise progression was also introduced and progression will  be customized based on patient's performance and goals.         Exercise Goals and Review:   Exercise Goals     Row Name 07/21/23 1317             Exercise Goals   Increase Physical Activity Yes       Intervention Provide advice, education, support and counseling about physical activity/exercise needs.;Develop an individualized exercise prescription for aerobic and resistive training based on initial evaluation findings, risk stratification, comorbidities and participant's personal goals.       Expected Outcomes Long Term: Exercising regularly at least 3-5 days a week.;Long Term: Add in home exercise to make exercise part of routine and to increase amount of physical activity.;Short Term: Attend rehab on a regular basis to increase amount of physical activity.       Increase Strength and Stamina Yes       Intervention Provide advice, education, support and counseling about physical activity/exercise needs.;Develop an individualized exercise prescription for aerobic and resistive training based on initial evaluation findings, risk stratification, comorbidities and participant's personal goals.       Expected Outcomes Short Term: Increase workloads from initial exercise prescription for resistance, speed, and METs.;Long Term: Improve cardiorespiratory fitness, muscular endurance and strength as measured by increased METs and functional capacity ( );Short Term: Perform resistance training exercises routinely during rehab and add in resistance training at home       Able to understand and use rate of perceived exertion (RPE) scale Yes       Intervention Provide education and explanation on how to use RPE scale       Expected  Outcomes Short Term: Able to use RPE daily in rehab to express subjective intensity level;Long Term:  Able to use RPE to guide intensity level when exercising independently       Able to understand and use Dyspnea scale Yes       Intervention Provide education and  explanation on how to use Dyspnea scale       Expected Outcomes Short Term: Able to use Dyspnea scale daily in rehab to express subjective sense of shortness of breath during exertion;Long Term: Able to use Dyspnea scale to guide intensity level when exercising independently       Knowledge and understanding of Target Heart Rate Range (THRR) Yes       Intervention Provide education and explanation of THRR including how the numbers were predicted and where they are located for reference       Expected Outcomes Short Term: Able to state/look up THRR;Short Term: Able to use daily as guideline for intensity in rehab;Long Term: Able to use THRR to govern intensity when exercising independently       Able to check pulse independently Yes       Intervention Provide education and demonstration on how to check pulse in carotid and radial arteries.;Review the importance of being able to check your own pulse for safety during independent exercise       Expected Outcomes Long Term: Able to check pulse independently and accurately;Short Term: Able to explain why pulse checking is important during independent exercise       Understanding of Exercise Prescription Yes       Intervention Provide education, explanation, and written materials on patient's individual exercise prescription       Expected Outcomes Long Term: Able to explain home exercise prescription to exercise independently;Short Term: Able to explain program exercise prescription          Exercise Goals Re-Evaluation :  Exercise Goals Re-Evaluation     Row Name 08/16/23 1044             Exercise Goal Re-Evaluation   Exercise Goals Review Able to understand and use Dyspnea scale;Able to understand and use rate of perceived exertion (RPE) scale;Knowledge and understanding of Target Heart Rate Range (THRR)       Comments Reviewed RPE and dyspnea scale, THR and program prescription with pt today.  Pt voiced understanding and was given a copy of  goals to take home.       Expected Outcomes Short: Use RPE daily to regulate intensity.  Long: Follow program prescription in THR.           Discharge Exercise Prescription (Final Exercise Prescription Changes):  Exercise Prescription Changes - 08/17/23 1000       Response to Exercise   Blood Pressure (Admit) 124/64    Blood Pressure (Exercise) 148/80    Blood Pressure (Exit) 124/80    Heart Rate (Admit) 68 bpm    Heart Rate (Exercise) 1113 bpm    Heart Rate (Exit) 77 bpm    Rating of Perceived Exertion (Exercise) 12    Duration Continue with 30 min of aerobic exercise without signs/symptoms of physical distress.    Intensity THRR unchanged      Progression   Progression Continue to progress workloads to maintain intensity without signs/symptoms of physical distress.      Resistance Training   Training Prescription Yes    Weight 3    Reps 10-15      NuStep   Level 1  SPM 73    Minutes 15    METs 1.7      Track   Laps 14    Minutes 15    METs 1.6          Nutrition:  Target Goals: Understanding of nutrition guidelines, daily intake of sodium 1500mg , cholesterol 200mg , calories 30% from fat and 7% or less from saturated fats, daily to have 5 or more servings of fruits and vegetables.  Biometrics:  Pre Biometrics - 08/09/23 1543       Pre Biometrics   Height 5' 0.5 (1.537 m)    Weight 164 lb 14.5 oz (74.8 kg)    Waist Circumference 30 inches    Hip Circumference 43 inches    Waist to Hip Ratio 0.7 %    BMI (Calculated) 31.66    Grip Strength 10.1 kg           Nutrition Therapy Plan and Nutrition Goals:  Nutrition Therapy & Goals - 07/21/23 1318       Intervention Plan   Intervention Prescribe, educate and counsel regarding individualized specific dietary modifications aiming towards targeted core components such as weight, hypertension, lipid management, diabetes, heart failure and other comorbidities.;Nutrition handout(s) given to patient.     Expected Outcomes Short Term Goal: Understand basic principles of dietary content, such as calories, fat, sodium, cholesterol and nutrients.;Short Term Goal: A plan has been developed with personal nutrition goals set during dietitian appointment.;Long Term Goal: Adherence to prescribed nutrition plan.          Nutrition Assessments:  MEDIFICTS Score Key: >=70 Need to make dietary changes  40-70 Heart Healthy Diet <= 40 Therapeutic Level Cholesterol Diet   Picture Your Plate Scores: <59 Unhealthy dietary pattern with much room for improvement. 41-50 Dietary pattern unlikely to meet recommendations for good health and room for improvement. 51-60 More healthful dietary pattern, with some room for improvement.  >60 Healthy dietary pattern, although there may be some specific behaviors that could be improved.    Nutrition Goals Re-Evaluation:   Nutrition Goals Discharge (Final Nutrition Goals Re-Evaluation):   Psychosocial: Target Goals: Acknowledge presence or absence of significant depression and/or stress, maximize coping skills, provide positive support system. Participant is able to verbalize types and ability to use techniques and skills needed for reducing stress and depression.  Initial Review & Psychosocial Screening:  Initial Psych Review & Screening - 07/21/23 1320       Family Dynamics   Comments Daughter is main support system, she lives alone.          Quality of Life Scores:  Scores of 19 and below usually indicate a poorer quality of life in these areas.  A difference of  2-3 points is a clinically meaningful difference.  A difference of 2-3 points in the total score of the Quality of Life Index has been associated with significant improvement in overall quality of life, self-image, physical symptoms, and general health in studies assessing change in quality of life.  PHQ-9: Review Flowsheet       08/09/2023  Depression screen PHQ 2/9  Decreased Interest  0  Down, Depressed, Hopeless 0  PHQ - 2 Score 0  Altered sleeping 2  Tired, decreased energy 3  Change in appetite 0  Feeling bad or failure about yourself  0  Trouble concentrating 1  Moving slowly or fidgety/restless 0  Suicidal thoughts 0  PHQ-9 Score 6  Difficult doing work/chores Not difficult at all   Interpretation of  Total Score  Total Score Depression Severity:  1-4 = Minimal depression, 5-9 = Mild depression, 10-14 = Moderate depression, 15-19 = Moderately severe depression, 20-27 = Severe depression   Psychosocial Evaluation and Intervention:  Psychosocial Evaluation - 07/21/23 1320       Psychosocial Evaluation & Interventions   Interventions Encouraged to exercise with the program and follow exercise prescription    Comments Ms. Borromeo is a pleasant lady who is eager to start our program. 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 She was discharged on 06/21/23 to a SNF in [place] briefly for a week. PT encouraged walking with a rollating walker upon discharge. Upon talking with Corena she states that she has had 1 fall in the last year and that was while she was hospitalized. She said it all happened so fast that she doesn't really remember what happened, but that she did get hurt from the fall. She says that she uses the walker as needed. She seems to be a little deconditioned with the history of a fall, arthritis, muscle weakness, etc. She is retired and for fun she likes to shop and decorate. She will be coming in for orientation with an EP on 08/01/23.    Expected Outcomes Short: Completely independent off the walker. Long: Increase strength and endurance.    Continue Psychosocial Services  Follow up required by staff          Psychosocial Re-Evaluation:   Psychosocial Discharge (Final Psychosocial Re-Evaluation):   Vocational Rehabilitation: Provide vocational rehab assistance to qualifying candidates.   Vocational Rehab Evaluation &  Intervention:  Vocational Rehab - 07/21/23 1308       Initial Vocational Rehab Evaluation & Intervention   Assessment shows need for Vocational Rehabilitation No          Education: Education Goals: Education classes will be provided on a weekly basis, covering required topics. Participant will state understanding/return demonstration of topics presented.  Learning Barriers/Preferences:  Learning Barriers/Preferences - 07/21/23 1307       Learning Barriers/Preferences   Learning Barriers Sight   Wears glasses   Learning Preferences Skilled Demonstration;Video;Group Instruction;Audio;Written Material          Education Topics: Hypertension, Hypertension Reduction -Define heart disease and high blood pressure. Discus how high blood pressure affects the body and ways to reduce high blood pressure.   Exercise and Your Heart -Discuss why it is important to exercise, the FITT principles of exercise, normal and abnormal responses to exercise, and how to exercise safely.   Angina -Discuss definition of angina, causes of angina, treatment of angina, and how to decrease risk of having angina.   Cardiac Medications -Review what the following cardiac medications are used for, how they affect the body, and side effects that may occur when taking the medications.  Medications include Aspirin, Beta blockers, calcium channel blockers, ACE Inhibitors, angiotensin receptor blockers, diuretics, digoxin, and antihyperlipidemics. Flowsheet Row CARDIAC REHAB PHASE II EXERCISE from 08/16/2023 in [facility] [facility]  Date 08/16/23  Educator jh  Instruction Review Code 1- Verbalizes Understanding    Congestive Heart Failure -Discuss the definition of CHF, how to live with CHF, the signs and symptoms of CHF, and how keep track of weight and sodium intake.   Heart Disease and Intimacy -Discus the effect sexual activity has on the heart, how changes occur during intimacy as we  age, and safety during sexual activity.   Smoking Cessation / COPD -Discuss different methods to quit  smoking, the health benefits of quitting smoking, and the definition of COPD.   Nutrition I: Fats -Discuss the types of cholesterol, what cholesterol does to the heart, and how cholesterol levels can be controlled.   Nutrition II: Labels -Discuss the different components of food labels and how to read food label   Heart Parts/Heart Disease and PAD -Discuss the anatomy of the heart, the pathway of blood circulation through the heart, and these are affected by heart disease.   Stress I: Signs and Symptoms -Discuss the causes of stress, how stress may lead to anxiety and depression, and ways to limit stress.   Stress II: Relaxation -Discuss different types of relaxation techniques to limit stress.   Warning Signs of Stroke / TIA -Discuss definition of a stroke, what the signs and symptoms are of a stroke, and how to identify when someone is having stroke.   Knowledge Questionnaire Score:   Core Components/Risk Factors/Patient Goals at Admission:  Personal Goals and Risk Factors at Admission - 07/21/23 1308       Core Components/Risk Factors/Patient Goals on Admission   Improve shortness of breath with ADL's Yes    Intervention Provide education, individualized exercise plan and daily activity instruction to help decrease symptoms of SOB with activities of daily living.    Expected Outcomes Short Term: Improve cardiorespiratory fitness to achieve a reduction of symptoms when performing ADLs;Long Term: Be able to perform more ADLs without symptoms or delay the onset of symptoms    Increase knowledge of respiratory medications and ability to use respiratory devices properly  Yes    Intervention Provide education and demonstration as needed of appropriate use of medications, inhalers, and oxygen therapy.    Expected Outcomes Long Term: Maintain appropriate use of medications,  inhalers, and oxygen therapy.;Short Term: Achieves understanding of medications use. Understands that oxygen is a medication prescribed by physician. Demonstrates appropriate use of inhaler and oxygen therapy.    Diabetes Yes    Intervention Provide education about signs/symptoms and action to take for hypo/hyperglycemia.;Provide education about proper nutrition, including hydration, and aerobic/resistive exercise prescription along with prescribed medications to achieve blood glucose in normal ranges: Fasting glucose 65-99 mg/dL    Expected Outcomes Short Term: Participant verbalizes understanding of the signs/symptoms and immediate care of hyper/hypoglycemia, proper foot care and importance of medication, aerobic/resistive exercise and nutrition plan for blood glucose control.;Long Term: Attainment of HbA1C < 7%.    Hypertension Yes    Intervention Provide education on lifestyle modifcations including regular physical activity/exercise, weight management, moderate sodium restriction and increased consumption of fresh fruit, vegetables, and low fat dairy, alcohol moderation, and smoking cessation.;Monitor prescription use compliance.    Expected Outcomes Long Term: Maintenance of blood pressure at goal levels.;Short Term: Continued assessment and intervention until BP is < 140/21mm HG in hypertensive participants. < 130/18mm HG in hypertensive participants with diabetes, heart failure or chronic kidney disease.    Stress Yes    Intervention Offer individual and/or small group education and counseling on adjustment to heart disease, stress management and health-related lifestyle change. Teach and support self-help strategies.;Refer participants experiencing significant psychosocial distress to appropriate mental health specialists for further evaluation and treatment. When possible, include family members and significant others in education/counseling sessions.    Expected Outcomes Short Term: Participant  demonstrates changes in health-related behavior, relaxation and other stress management skills, ability to obtain effective social support, and compliance with psychotropic medications if prescribed.;Long Term: Emotional wellbeing is indicated by absence of clinically significant  psychosocial distress or social isolation.          Core Components/Risk Factors/Patient Goals Review:    Core Components/Risk Factors/Patient Goals at Discharge (Final Review):    ITP Comments:  ITP Comments     Row Name 07/21/23 1320 08/16/23 1043 09/06/23 0903 09/15/23 1106     ITP Comments Completed virtual orientation today.  EP evaluation is scheduled for 08/01/23 at 1:00pm.  Documentation for diagnosis can be found in [facility] encounter 06/13/23. First full day of exercise!  Patient was oriented to gym and equipment including functions, settings, policies, and procedures.  Patient's individual exercise prescription and treatment plan were reviewed.  All starting workloads were established based on the results of the 6 minute walk test done at initial orientation visit.  The plan for exercise progression was also introduced and progression will be customized based on patient's performance and goals. 30 day review completed. ITP sent to Dr. Dorn Ross, Medical Director of Cardiac Rehab. Continue with ITP unless changes are made by physician. Patient is new to the program and has only attended 2 sessions after her orientation. She has been contacted and says she has MD appointments but attendance remains inconsistent. Attempted to call patient regarding cardiac rehab. Mailbox is full. She returned call and said she was not going to be able to complete the program. She recently saw her cardiologist and they discussed with her daughter placement in an assisted living facility in [place], [place]. She got lost driving from [place] back to her home in [place]. MD did not recommended driving. Discharging patient.        Comments: Discharge ITP

## 2023-09-27 ENCOUNTER — Encounter (HOSPITAL_COMMUNITY)

## 2023-09-29 ENCOUNTER — Encounter (HOSPITAL_COMMUNITY)

## 2023-10-02 ENCOUNTER — Encounter (HOSPITAL_COMMUNITY)

## 2023-10-04 ENCOUNTER — Encounter (HOSPITAL_COMMUNITY)

## 2023-10-06 ENCOUNTER — Encounter (HOSPITAL_COMMUNITY)

## 2023-10-09 ENCOUNTER — Encounter (HOSPITAL_COMMUNITY)

## 2023-10-11 ENCOUNTER — Encounter (HOSPITAL_COMMUNITY)

## 2023-10-13 ENCOUNTER — Encounter (HOSPITAL_COMMUNITY)

## 2023-10-16 ENCOUNTER — Encounter (HOSPITAL_COMMUNITY)

## 2023-10-18 ENCOUNTER — Encounter (HOSPITAL_COMMUNITY)

## 2023-10-20 ENCOUNTER — Encounter (HOSPITAL_COMMUNITY)

## 2023-10-23 ENCOUNTER — Encounter (HOSPITAL_COMMUNITY)

## 2023-10-25 ENCOUNTER — Encounter (HOSPITAL_COMMUNITY)

## 2023-10-27 ENCOUNTER — Encounter (HOSPITAL_COMMUNITY)

## 2023-10-30 ENCOUNTER — Encounter (HOSPITAL_COMMUNITY)

## 2023-11-01 ENCOUNTER — Encounter (HOSPITAL_COMMUNITY)

## 2023-11-03 ENCOUNTER — Encounter (HOSPITAL_COMMUNITY)

## 2024-03-26 NOTE — Telephone Encounter (Signed)
 Spoke with patient regarding her missed appt's this past week. States she has had doctor's appt's. Plans on being here this Friday at 11:00.
